# Patient Record
Sex: Female | Born: 2000 | Race: Black or African American | Hispanic: No | Marital: Single | Smoking: Never smoker
Health system: Southern US, Community
[De-identification: ages and names within clinical notes are randomized; demographics above are authoritative.]

## PROBLEM LIST (undated history)

## (undated) DIAGNOSIS — B009 Herpesviral infection, unspecified: Secondary | ICD-10-CM

## (undated) DIAGNOSIS — J45909 Unspecified asthma, uncomplicated: Secondary | ICD-10-CM

## (undated) HISTORY — DX: Herpesviral infection, unspecified: B00.9

---
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
## 2018-03-16 ENCOUNTER — Ambulatory Visit: Payer: BC Managed Care – PPO | Admitting: Nurse Practitioner

## 2018-03-16 ENCOUNTER — Encounter: Payer: Self-pay | Admitting: Nurse Practitioner

## 2018-03-16 VITALS — BP 100/64 | HR 87 | Temp 98.4°F | Ht 61.2 in | Wt 101.6 lb

## 2018-03-16 DIAGNOSIS — N946 Dysmenorrhea, unspecified: Secondary | ICD-10-CM

## 2018-03-16 DIAGNOSIS — Z0000 Encounter for general adult medical examination without abnormal findings: Secondary | ICD-10-CM | POA: Diagnosis not present

## 2018-03-16 DIAGNOSIS — R05 Cough: Secondary | ICD-10-CM

## 2018-03-16 DIAGNOSIS — N922 Excessive menstruation at puberty: Secondary | ICD-10-CM

## 2018-03-16 DIAGNOSIS — Z30011 Encounter for initial prescription of contraceptive pills: Secondary | ICD-10-CM | POA: Diagnosis not present

## 2018-03-16 DIAGNOSIS — N920 Excessive and frequent menstruation with regular cycle: Secondary | ICD-10-CM | POA: Diagnosis not present

## 2018-03-16 DIAGNOSIS — Z8709 Personal history of other diseases of the respiratory system: Secondary | ICD-10-CM

## 2018-03-16 MED ORDER — NORETHINDRONE ACET-ETHINYL EST 1-20 MG-MCG PO TABS: 1.0000 | ORAL_TABLET | Freq: Every day | ORAL | 3 refills | Status: DC

## 2018-03-16 MED ORDER — NORETHINDRONE ACET-ETHINYL EST 1-20 MG-MCG PO TABS: 1.0000 | ORAL_TABLET | Freq: Every day | ORAL | 11 refills | Status: DC

## 2018-03-16 NOTE — Progress Notes (Addendum)
Subjective:     Patient ID: Selena Wallace , female    DOB: Jul 24, 2000 , 18 y.o.   MRN: [id number]   Chief Complaint  Patient presents with  . Establish Care  . Asthma    patient needs a refill  . Contraception    HPI  Here to establish care - she had been going to [facility].  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  Her mother referred her here she works at [employer].  She is working at [employer] at [employer]. 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 She usually takes the flu shot.    PMH - asthmatic tendencies (1 asthma attack as a child).  - uses albuterol inhaler as needed.    [facility] - mother - prediabetic, father - HTN.  3 brothers and 2 sisters - all healthy.              Grandparents - diabetes.  Maternal grandfather - heart disease.  Great aunt maternal - breast cancer.              Paternal grandmother possible Alzheimer's   Non- smoker, no alcohol.    Not sexually active. LMP - 03/12/2018 - regular.  dysmenorrhgia and menorrhagia - heavy bleeding during her cycle.  She has to change her pads at least every 2 hours has intermittent dizziness.  Takes 4 ibuprofen.  Menstrual cycle last for 7 days.    Asthma  She complains of cough (dry). There is no shortness of breath or wheezing. This is a chronic problem. The current episode started more than 1 year ago. The problem occurs rarely. Associated symptoms include postnasal drip and a sore throat. Pertinent negatives include no appetite change, fever or headaches. Her symptoms are aggravated by nothing. Her symptoms are alleviated by nothing. Her past medical history is significant for asthma.  URI   This is a new problem. The current episode started in the past 7 days. There has been no fever. Associated symptoms include congestion, coughing (dry) and a sore throat. Pertinent negatives include no headaches, sinus pain or wheezing. She has tried nothing for the symptoms.     No past medical history on file.   No family history on  file.   Current Outpatient Medications:  .  Albuterol Sulfate 108 (90 Base) MCG/ACT AEPB, Inhale 2 puffs into the lungs as needed., Disp: , Rfl:    Allergies no known allergies   Review of Systems  Constitutional: Negative for appetite change and fever.  HENT: Positive for congestion, postnasal drip and sore throat. Negative for sinus pain.   Respiratory: Positive for cough (dry). Negative for shortness of breath and wheezing.   Cardiovascular: Negative.   Endocrine: Negative for polydipsia, polyphagia and polyuria.  Genitourinary: Positive for vaginal bleeding (currently on menstrual cycle). Negative for vaginal discharge.  Musculoskeletal: Negative.   Skin: Negative.   Neurological: Negative.  Negative for headaches.  Hematological: Negative.   Psychiatric/Behavioral: Negative.      Today's Vitals   03/16/18 1433  BP: 100/64  Pulse: 87  Temp: 98.4 F (36.9 C)  TempSrc: Oral  SpO2: 98%  Weight: 101 lb 9.6 oz (46.1 kg)  Height: 5' 1.2" (1.554 m)  PainSc: 0-No pain   Body mass index is 19.07 kg/m.   Objective:  Physical Exam Constitutional:      General: She is not in acute distress.    Appearance: Normal appearance. She is well-developed.  HENT:     Head: Normocephalic  and atraumatic.     Right Ear: Hearing normal.     Left Ear: Hearing normal.     Mouth/Throat:     Mouth: Mucous membranes are moist.  Eyes:     General: Lids are normal.     Conjunctiva/sclera: Conjunctivae normal.     Pupils: Pupils are equal, round, and reactive to light.     Funduscopic exam:    Right eye: No papilledema.        Left eye: No papilledema.  Neck:     Musculoskeletal: Full passive range of motion without pain, normal range of motion and neck supple.     Thyroid: No thyroid mass.     Vascular: No carotid bruit.  Cardiovascular:     Rate and Rhythm: Normal rate and regular rhythm.     Pulses: Normal pulses.     Heart sounds: Normal heart sounds. No murmur.  Pulmonary:      Effort: Pulmonary effort is normal.     Breath sounds: Normal breath sounds.  Abdominal:     General: Abdomen is flat. Bowel sounds are normal.     Palpations: Abdomen is soft.  Musculoskeletal: Normal range of motion.        General: No swelling.     Right lower leg: No edema.     Left lower leg: No edema.  Skin:    General: Skin is warm and dry.     Capillary Refill: Capillary refill takes less than 2 seconds.  Neurological:     General: No focal deficit present.     Mental Status: She is alert and oriented to person, place, and time.     Cranial Nerves: No cranial nerve deficit.     Sensory: No sensory deficit.  Psychiatric:        Mood and Affect: Mood normal.        Behavior: Behavior normal.        Thought Content: Thought content normal.        Judgment: Judgment normal.       Assessment And Plan:     1. Dysmenorrhea  Encouraged to take ibuprofen/aleve 3 days prior to start of her menstrual cycle.  2. Excessive menstruation at puberty  She will change her pad at least every 2 hours and last for 7 days  Interested in birth control pills to regulate - CBC with Diff - BMP8+eGFR  3. Oral contraception initial prescription  Denies history of headaches/migraines  Discussed Red Flag - ACHES - POCT Urine Pregnancy  4. History of asthma  Chronic, controlled  Provided her with albuterol inhaler as needed  If have to use more than 3 times in one week she is to call to office  5. Health maintenance examination . Behavior modifications discussed and diet history reviewed.   . Pt will continue to exercise regularly and modify diet with low GI, plant based foods and decrease intake of processed foods.  . Recommend intake of daily multivitamin, Vitamin D . Recommend immunizations that include influenza, TDAP (she is to get a copy of her immunization records)     Minette Brine, FNP

## 2018-03-16 NOTE — Patient Instructions (Addendum)
Dysmenorrhea Dysmenorrhea means painful cramps during your period (menstrual period). You will have pain in your lower belly (abdomen). The pain is caused by the tightening (contracting) of the muscles of the womb (uterus). The pain may be mild or very bad. With this condition, you may:  Have a headache.  Feel sick to your stomach (nauseous).  Throw up (vomit).  Have lower back pain. Follow these instructions at home: Helping pain and cramping   Put heat on your lower back or belly when you have pain or cramps. Use the heat source that your doctor tells you to use. ? Place a towel between your skin and the heat. ? Leave the heat on for 20-30 minutes. ? Remove the heat if your skin turns bright red. This is especially important if you cannot feel pain, heat, or cold. ? Do not have a heating pad on during sleep.  Do aerobic exercises. These include walking, swimming, or biking. These may help with cramps.  Massage your lower back or belly. This may help lessen pain. General instructions  Take over-the-counter and prescription medicines only as told by your doctor.  Do not drive or use heavy machinery while taking prescription pain medicine.  Avoid alcohol and caffeine during and right before your period. These can make cramps worse.  Do not use any products that have nicotine or tobacco. These include cigarettes and e-cigarettes. If you need help quitting, ask your doctor.  Keep all follow-up visits as told by your doctor. This is important. Contact a doctor if:  You have pain that gets worse.  You have pain that does not get better with medicine.  You have pain during sex.  You feel sick to your stomach or you throw up during your period, and medicine does not help. Get help right away if:  You pass out (faint). Summary  Dysmenorrhea means painful cramps during your period (menstrual period).  Put heat on your lower back or belly when you have pain or cramps.  Do  exercises like walking, swimming, or biking to help with cramps.  Contact a doctor if you have pain during sex. This information is not intended to replace advice given to you by your health care provider. Make sure you discuss any questions you have with your health care provider. Document Released: 05/22/2008 Document Revised: 03/12/2016 Document Reviewed: 03/12/2016 Elsevier Interactive Patient Education  2019 [employer] Inc[employer]   Oral Contraception Use Oral contraceptive pills (OCPs) are medicines that you take to prevent pregnancy. OCPs work by:  Preventing the ovaries from releasing eggs.  Thickening mucus in the lower part of the uterus (cervix), which prevents sperm from entering the uterus.  Thinning the lining of the uterus (endometrium), which prevents a fertilized egg from attaching to the endometrium. OCPs are highly effective when taken exactly as prescribed. However, OCPs do not prevent sexually transmitted infections (STIs). Safe sex practices, such as using condoms while on an OCP, can help prevent STIs. Before taking OCPs, you may have a physical exam, blood test, and Pap test. A Pap test involves taking a sample of cells from your cervix to check for cancer. Discuss with your health care provider the possible side effects of the OCP you may be prescribed. When you start an OCP, be aware that it can take 2-3 months for your body to adjust to changes in hormone levels. How to take oral contraceptive pills Follow instructions from your health care provider about how to start taking your first cycle of  OCPs. Your health care provider may recommend that you:  Start the pill on day 1 of your menstrual period. If you start at this time, you will not need any backup form of birth control (contraception), such as condoms.  Start the pill on the first Sunday after your menstrual period or on the day you get your prescription. In these cases, you will need to use backup contraception for  the first week.  Start the pill at any time of your cycle. ? If you take the pill within 5 days of the start of your period, you will not need a backup form of contraception. ? If you start at any other time of your menstrual cycle, you will need to use another form of contraception for 7 days. If your OCP is the type called a minipill, it will protect you from pregnancy after taking it for 2 days (48 hours), and you can stop using backup contraception after that time. After you have started taking OCPs:  If you forget to take 1 pill, take it as soon as you remember. Take the next pill at the regular time.  If you miss 2 or more pills, call your health care provider. Different pills have different instructions for missed doses. Use backup birth control until your next menstrual period starts.  If you use a 28-day pack that contains inactive pills and you miss 1 of the last 7 pills (pills with no hormones), throw away the rest of the non-hormone pills and start a new pill pack. No matter which day you start the OCP, you will always start a new pack on that same day of the week. Have an extra pack of OCPs and a backup contraceptive method available in case you miss some pills or lose your OCP pack. Follow these instructions at home:  Do not use any products that contain nicotine or tobacco, such as cigarettes and e-cigarettes. If you need help quitting, ask your health care provider.  Always use a condom to protect against STIs. OCPs do not protect against STIs.  Use a calendar to mark the days of your menstrual period.  Read the information and directions that came with your OCP. Talk to your health care provider if you have questions. Contact a health care provider if:  You develop nausea and vomiting.  You have abnormal vaginal discharge or bleeding.  You develop a rash.  You miss your menstrual period. Depending on the type of OCP you are taking, this may be a sign of pregnancy. Ask  your health care provider for more information.  You are losing your hair.  You need treatment for mood swings or depression.  You get dizzy when taking the OCP.  You develop acne after taking the OCP.  You become pregnant or think you may be pregnant.  You have diarrhea, constipation, and abdominal pain or cramps.  You miss 2 or more pills. Get help right away if:  You develop chest pain.  You develop shortness of breath.  You have an uncontrolled or severe headache.  You develop numbness or slurred speech.  You develop visual or speech problems.  You develop pain, redness, and swelling in your legs.  You develop weakness or numbness in your arms or legs. Summary  Oral contraceptive pills (OCPs) are medicines that you take to prevent pregnancy.  OCPs do not prevent sexually transmitted infections (STIs). Always use a condom to protect against STIs.  When you start an OCP, be aware  that it can take 2-3 months for your body to adjust to changes in hormone levels.  Read all the information and directions that come with your OCP. This information is not intended to replace advice given to you by your health care provider. Make sure you discuss any questions you have with your health care provider. Document Released: 02/12/2011 Document Revised: 04/06/2016 Document Reviewed: 04/06/2016 Elsevier Interactive Patient Education  2019 [employer].

## 2018-03-17 LAB — BMP8+EGFR
BUN / CREAT RATIO: 21 (ref 9–23)
BUN: 16 mg/dL (ref 6–20)
CHLORIDE: 103 mmol/L (ref 96–106)
CO2: 22 mmol/L (ref 20–29)
Calcium: 9.5 mg/dL (ref 8.7–10.2)
Creatinine, Ser: 0.75 mg/dL (ref 0.57–1.00)
GFR calc Af Amer: 134 mL/min/{1.73_m2} (ref >59)
GFR calc non Af Amer: 116 mL/min/{1.73_m2} (ref >59)
GLUCOSE: 70 mg/dL (ref 65–99)
Potassium: 4.4 mmol/L (ref 3.5–5.2)
Sodium: 140 mmol/L (ref 134–144)

## 2018-03-17 LAB — CBC WITH DIFFERENTIAL/PLATELET
BASOS ABS: 0 10*3/uL (ref 0.0–0.2)
Basos: 1 %
EOS (ABSOLUTE): 0.1 10*3/uL (ref 0.0–0.4)
Eos: 2 %
Hematocrit: 40.6 % (ref 34.0–46.6)
Hemoglobin: 13.3 g/dL (ref 11.1–15.9)
IMMATURE GRANULOCYTES: 0 %
Immature Grans (Abs): 0 10*3/uL (ref 0.0–0.1)
LYMPHS: 23 %
Lymphocytes Absolute: 1.7 10*3/uL (ref 0.7–3.1)
MCH: 27.5 pg (ref 26.6–33.0)
MCHC: 32.8 g/dL (ref 31.5–35.7)
MCV: 84 fL (ref 79–97)
Monocytes Absolute: 0.4 10*3/uL (ref 0.1–0.9)
Monocytes: 5 %
NEUTROS PCT: 69 %
Neutrophils Absolute: 5 10*3/uL (ref 1.4–7.0)
PLATELETS: 316 10*3/uL (ref 150–450)
RBC: 4.83 x10E6/uL (ref 3.77–5.28)
RDW: 12.6 % (ref 11.7–15.4)
WBC: 7.3 10*3/uL (ref 3.4–10.8)

## 2018-04-03 ENCOUNTER — Encounter: Payer: Self-pay | Admitting: Nurse Practitioner

## 2018-04-03 LAB — POCT URINE PREGNANCY: PREG TEST UR: NEGATIVE

## 2018-05-19 ENCOUNTER — Ambulatory Visit: Payer: BC Managed Care – PPO | Admitting: Nurse Practitioner

## 2018-05-19 ENCOUNTER — Other Ambulatory Visit: Payer: Self-pay

## 2018-05-19 ENCOUNTER — Encounter: Payer: Self-pay | Admitting: Nurse Practitioner

## 2018-05-19 VITALS — BP 100/60 | HR 84 | Temp 98.1°F | Wt 103.2 lb

## 2018-05-19 DIAGNOSIS — J029 Acute pharyngitis, unspecified: Secondary | ICD-10-CM | POA: Diagnosis not present

## 2018-05-19 DIAGNOSIS — Z30011 Encounter for initial prescription of contraceptive pills: Secondary | ICD-10-CM | POA: Diagnosis not present

## 2018-05-19 DIAGNOSIS — J309 Allergic rhinitis, unspecified: Secondary | ICD-10-CM

## 2018-05-19 LAB — POCT RAPID STREP A (OFFICE): RAPID STREP A SCREEN: NEGATIVE

## 2018-05-19 MED ORDER — NORETHINDRONE ACET-ETHINYL EST 1-20 MG-MCG PO TABS: 1.0000 | ORAL_TABLET | Freq: Every day | ORAL | 2 refills | Status: DC

## 2018-05-19 MED ORDER — CARBINOXAMINE MALEATE 6 MG PO TABS: 1.0000 | ORAL_TABLET | Freq: Two times a day (BID) | ORAL | 1 refills | Status: DC

## 2018-05-19 NOTE — Progress Notes (Signed)
  Subjective:     Patient ID: Selena Wallace , female    DOB: May 07, 2000 , 18 y.o.   MRN: [id number]   Chief Complaint  Patient presents with  . left ear  feels full     sinus drain in throat,  left ear feels full, no fever , had some runny nose     HPI  Allergy symptoms - started on Monday with post nasal drainage.  She continues to have left ear discomfort at night. No fever.  No recent travel out of the country.      No past medical history on file.   No family history on file.   Current Outpatient Medications:  .  Albuterol Sulfate 108 (90 Base) MCG/ACT AEPB, Inhale 2 puffs into the lungs as needed., Disp: , Rfl:  .  norethindrone-ethinyl estradiol (LOESTRIN 1/20, 21,) 1-20 MG-MCG tablet, Take 1 tablet by mouth daily., Disp: 3 Package, Rfl: 3   No Known Allergies   Review of Systems  Constitutional: Negative for fatigue.  HENT: Negative for congestion, sinus pressure, sinus pain and sore throat.   Respiratory: Negative for cough.   Cardiovascular: Negative.  Negative for chest pain, palpitations and leg swelling.  Endocrine: Negative for polydipsia, polyphagia and polyuria.     Today's Vitals   05/19/18 1444  BP: 100/60  Pulse: 84  Temp: 98.1 F (36.7 C)  SpO2: 97%  Weight: 103 lb 3.2 oz (46.8 kg)   Body mass index is 19.37 kg/m.   Objective:  Physical Exam Vitals signs reviewed.  Constitutional:      Appearance: Normal appearance.  HENT:     Right Ear: Tympanic membrane, ear canal and external ear normal. There is no impacted cerumen.     Left Ear: Ear canal and external ear normal. There is no impacted cerumen.     Nose: Nose normal. No congestion.     Mouth/Throat:     Mouth: Mucous membranes are moist.     Pharynx: Oropharyngeal exudate (left tonsil) present.  Cardiovascular:     Rate and Rhythm: Normal rate and regular rhythm.     Pulses: Normal pulses.     Heart sounds: Normal heart sounds. No murmur.  Neurological:     Mental Status: She is  alert.         Assessment And Plan:   1. Oral contraception initial prescription  Discussed if have headaches, shortness of breath or sudden onset leg pain/swelling to contact office - norethindrone-ethinyl estradiol (MICROGESTIN,JUNEL,LOESTRIN) 1-20 MG-MCG tablet; Take 1 tablet by mouth daily.  Dispense: 3 Package; Refill: 2  2. Allergic rhinitis, unspecified seasonality, unspecified trigger  Slightly erythematous oropharynx  Will try ryvent - Carbinoxamine Maleate (RYVENT) 6 MG TABS; Take 1 each by mouth 2 (two) times daily.  Dispense: 60 tablet; Refill: 1  3. Sore throat  Negative rapid strep - POCT rapid strep A       Selena Felts, FNP

## 2018-06-06 ENCOUNTER — Encounter: Payer: Self-pay | Admitting: Nurse Practitioner

## 2018-06-06 DIAGNOSIS — J309 Allergic rhinitis, unspecified: Secondary | ICD-10-CM | POA: Insufficient documentation

## 2018-06-06 DIAGNOSIS — J029 Acute pharyngitis, unspecified: Secondary | ICD-10-CM | POA: Insufficient documentation

## 2018-09-01 ENCOUNTER — Other Ambulatory Visit: Payer: Self-pay

## 2018-09-01 ENCOUNTER — Encounter: Payer: Self-pay | Admitting: Nurse Practitioner

## 2018-09-01 ENCOUNTER — Ambulatory Visit: Payer: BC Managed Care – PPO | Admitting: Nurse Practitioner

## 2018-09-01 VITALS — BP 102/62 | HR 65 | Temp 98.3°F | Ht 61.6 in | Wt 106.0 lb

## 2018-09-01 DIAGNOSIS — R350 Frequency of micturition: Secondary | ICD-10-CM | POA: Diagnosis not present

## 2018-09-01 DIAGNOSIS — R1013 Epigastric pain: Secondary | ICD-10-CM | POA: Diagnosis not present

## 2018-09-01 DIAGNOSIS — K5900 Constipation, unspecified: Secondary | ICD-10-CM | POA: Diagnosis not present

## 2018-09-01 LAB — POCT URINALYSIS DIPSTICK
Bilirubin, UA: NEGATIVE
Glucose, UA: NEGATIVE
Leukocytes, UA: NEGATIVE
Nitrite, UA: NEGATIVE
Protein, UA: POSITIVE — AB
Spec Grav, UA: 1.025 (ref 1.010–1.025)
Urobilinogen, UA: 1 E.U./dL
pH, UA: 6.5 (ref 5.0–8.0)

## 2018-09-01 NOTE — Patient Instructions (Signed)
Take over the counter 1 tsp benefiber daily  Increase your intake fiber and water at least 4 bottles a day  Eat a healthy diet rich in vegetables.    Healthy Eating Following a healthy eating pattern may help you to achieve and maintain a healthy body weight, reduce the risk of chronic disease, and live a long and productive life. It is important to follow a healthy eating pattern at an appropriate calorie level for your body. Your nutritional needs should be met primarily through food by choosing a variety of nutrient-rich foods. What are tips for following this plan? Reading food labels  Read labels and choose the following: ? Reduced or low sodium. ? Juices with 100% fruit juice. ? Foods with low saturated fats and high polyunsaturated and monounsaturated fats. ? Foods with whole grains, such as whole wheat, cracked wheat, brown rice, and wild rice. ? Whole grains that are fortified with folic acid. This is recommended for women who are pregnant or who want to become pregnant.  Read labels and avoid the following: ? Foods with a lot of added sugars. These include foods that contain brown sugar, corn sweetener, corn syrup, dextrose, fructose, glucose, high-fructose corn syrup, honey, invert sugar, lactose, malt syrup, maltose, molasses, raw sugar, sucrose, trehalose, or turbinado sugar.  Do not eat more than the following amounts of added sugar per day:  6 teaspoons (25 g) for women.  9 teaspoons (38 g) for men. ? Foods that contain processed or refined starches and grains. ? Refined grain products, such as white flour, degermed cornmeal, white bread, and white rice. Shopping  Choose nutrient-rich snacks, such as vegetables, whole fruits, and nuts. Avoid high-calorie and high-sugar snacks, such as potato chips, fruit snacks, and candy.  Use oil-based dressings and spreads on foods instead of solid fats such as butter, stick margarine, or cream cheese.  Limit pre-made sauces,  mixes, and "instant" products such as flavored rice, instant noodles, and ready-made pasta.  Try more plant-protein sources, such as tofu, tempeh, black beans, edamame, lentils, nuts, and seeds.  Explore eating plans such as the Mediterranean diet or vegetarian diet. Cooking  Use oil to saut or stir-fry foods instead of solid fats such as butter, stick margarine, or lard.  Try baking, boiling, grilling, or broiling instead of frying.  Remove the fatty part of meats before cooking.  Steam vegetables in water or broth. Meal planning   At meals, imagine dividing your plate into fourths: ? One-half of your plate is fruits and vegetables. ? One-fourth of your plate is whole grains. ? One-fourth of your plate is protein, especially lean meats, poultry, eggs, tofu, beans, or nuts.  Include low-fat dairy as part of your daily diet. Lifestyle  Choose healthy options in all settings, including home, work, school, restaurants, or stores.  Prepare your food safely: ? Wash your hands after handling raw meats. ? Keep food preparation surfaces clean by regularly washing with hot, soapy water. ? Keep raw meats separate from ready-to-eat foods, such as fruits and vegetables. ? Cook seafood, meat, poultry, and eggs to the recommended internal temperature. ? Store foods at safe temperatures. In general:  Keep cold foods at 47F (4.4C) or below.  Keep hot foods at 147F (60C) or above.  Keep your freezer at [facility] (-17.8C) or below.  Foods are no longer safe to eat when they have been between the temperatures of 40-147F (4.4-60C) for more than 2 hours. What foods should I eat? Fruits Aim to  eat 2 cup-equivalents of fresh, canned (in natural juice), or frozen fruits each day. Examples of 1 cup-equivalent of fruit include 1 small apple, 8 large strawberries, 1 cup canned fruit,  cup dried fruit, or 1 cup 100% juice. Vegetables Aim to eat 2-3 cup-equivalents of fresh and frozen vegetables  each day, including different varieties and colors. Examples of 1 cup-equivalent of vegetables include 2 medium carrots, 2 cups raw, leafy greens, 1 cup chopped vegetable (raw or cooked), or 1 medium baked potato. Grains Aim to eat 6 ounce-equivalents of whole grains each day. Examples of 1 ounce-equivalent of grains include 1 slice of bread, 1 cup ready-to-eat cereal, 3 cups popcorn, or  cup cooked rice, pasta, or cereal. Meats and other proteins Aim to eat 5-6 ounce-equivalents of protein each day. Examples of 1 ounce-equivalent of protein include 1 egg, 1/2 cup nuts or seeds, or 1 tablespoon (16 g) peanut butter. A cut of meat or fish that is the size of a deck of cards is about 3-4 ounce-equivalents.  Of the protein you eat each week, try to have at least 8 ounces come from seafood. This includes salmon, trout, herring, and anchovies. Dairy Aim to eat 3 cup-equivalents of fat-free or low-fat dairy each day. Examples of 1 cup-equivalent of dairy include 1 cup (240 mL) milk, 8 ounces (250 g) yogurt, 1 ounces (44 g) natural cheese, or 1 cup (240 mL) fortified soy milk. Fats and oils  Aim for about 5 teaspoons (21 g) per day. Choose monounsaturated fats, such as canola and olive oils, avocados, peanut butter, and most nuts, or polyunsaturated fats, such as sunflower, corn, and soybean oils, walnuts, pine nuts, sesame seeds, sunflower seeds, and flaxseed. Beverages  Aim for six 8-oz glasses of water per day. Limit coffee to three to five 8-oz cups per day.  Limit caffeinated beverages that have added calories, such as soda and energy drinks.  Limit alcohol intake to no more than 1 drink a day for nonpregnant women and 2 drinks a day for men. One drink equals 12 oz of beer (355 mL), 5 oz of wine (148 mL), or 1 oz of hard liquor (44 mL). Seasoning and other foods  Avoid adding excess amounts of salt to your foods. Try flavoring foods with herbs and spices instead of salt.  Avoid adding sugar  to foods.  Try using oil-based dressings, sauces, and spreads instead of solid fats. This information is based on general [place] nutrition guidelines. For more information, visit [employer]. Exact amounts may vary based on your nutrition needs. Summary  A healthy eating plan may help you to maintain a healthy weight, reduce the risk of chronic diseases, and stay active throughout your life.  Plan your meals. Make sure you eat the right portions of a variety of nutrient-rich foods.  Try baking, boiling, grilling, or broiling instead of frying.  Choose healthy options in all settings, including home, work, school, restaurants, or stores. This information is not intended to replace advice given to you by your health care provider. Make sure you discuss any questions you have with your health care provider. Document Released: 06/07/2017 Document Revised: 06/07/2017 Document Reviewed: 06/07/2017 Elsevier Interactive Patient Education  2019 [employer].   Constipation, Adult Constipation is when a person:  Poops (has a bowel movement) fewer times in a week than normal.  Has a hard time pooping.  Has poop that is dry, hard, or bigger than normal. Follow these instructions at home: Eating and drinking  Eat foods that have a lot of fiber, such as: ? Fresh fruits and vegetables. ? Whole grains. ? Beans.  Eat less of foods that are high in fat, low in fiber, or overly processed, such as: ? [place] fries. ? Hamburgers. ? Cookies. ? Candy. ? Soda.  Drink enough fluid to keep your pee (urine) clear or pale yellow. General instructions  Exercise regularly or as told by your doctor.  Go to the restroom when you feel like you need to poop. Do not hold it in.  Take over-the-counter and prescription medicines only as told by your doctor. These include any fiber supplements.  Do pelvic floor retraining exercises, such as: ? Doing deep breathing while relaxing your lower belly  (abdomen). ? Relaxing your pelvic floor while pooping.  Watch your condition for any changes.  Keep all follow-up visits as told by your doctor. This is important. Contact a doctor if:  You have pain that gets worse.  You have a fever.  You have not pooped for 4 days.  You throw up (vomit).  You are not hungry.  You lose weight.  You are bleeding from the anus.  You have thin, pencil-like poop (stool). Get help right away if:  You have a fever, and your symptoms suddenly get worse.  You leak poop or have blood in your poop.  Your belly feels hard or bigger than normal (is bloated).  You have very bad belly pain.  You feel dizzy or you faint. This information is not intended to replace advice given to you by your health care provider. Make sure you discuss any questions you have with your health care provider. Document Released: 08/12/2007 Document Revised: 09/13/2015 Document Reviewed: 08/14/2015 Elsevier Interactive Patient Education  2019 [employer].

## 2018-09-01 NOTE — Progress Notes (Signed)
Subjective:     Patient ID: Selena Wallace , female    DOB: Sep 15, 2000 , 18 y.o.   MRN: [id number]   Chief Complaint  Patient presents with  . Abdominal Pain    patient states after she eats her stomach still hurts like she is still hungry pt states if she doesnt eat she feels dizzy  . Dysuria    patient states she is urinating more she denies any burning or discharge.    HPI  She feels like she is having "hunger" pains for the last week.  She feels like she always hungry.    Breakfast with oatmeal and water or cereal (frosted flakes) Lunch - burger with french fries - water Dinner - chicken wings or subway or spaghetti.    No vegetables in her diet  Exercising about 2 weeks ago where she is working out every day.  Running alternating with strength training.  Drinks about 2 16 oz bottles of water a day  Mild nausea.    She has had 1 bowel movement in the last 2 days.    Wt Readings from Last 3 Encounters: 09/01/18 : 106 lb (48.1 kg) (10 %, Z= -1.31)* 05/19/18 : 103 lb 3.2 oz (46.8 kg) (7 %, Z= -1.51)* 03/16/18 : 101 lb 9.6 oz (46.1 kg) (5 %, Z= -1.63)*  * Growth percentiles are based on CDC (Girls, 2-20 Years) data.   Abdominal Pain This is a new problem. The current episode started in the past 7 days. The onset quality is sudden. Pain location: mid epigastric region. Quality: cramping. Associated symptoms include dysuria. Pertinent negatives include no headaches. She has tried antacids for the symptoms.  Dysuria      No past medical history on file.   No family history on file.   Current Outpatient Medications:  .  Albuterol Sulfate 108 (90 Base) MCG/ACT AEPB, Inhale 2 puffs into the lungs as needed., Disp: , Rfl:  .  norethindrone-ethinyl estradiol (MICROGESTIN,JUNEL,LOESTRIN) 1-20 MG-MCG tablet, Take 1 tablet by mouth daily., Disp: 3 Package, Rfl: 2   No Known Allergies   Review of Systems  Constitutional: Negative.   Respiratory: Negative for cough.    Cardiovascular: Negative.   Gastrointestinal: Positive for abdominal pain.  Endocrine: Negative for polydipsia, polyphagia and polyuria.  Genitourinary: Positive for dysuria.  Neurological: Positive for dizziness. Negative for headaches.     Today's Vitals   09/01/18 0847  BP: 102/62  Pulse: 65  Temp: 98.3 F (36.8 C)  TempSrc: Oral  Weight: 106 lb (48.1 kg)  Height: 5' 1.6" (1.565 m)  PainSc: 0-No pain   Body mass index is 19.64 kg/m.   Objective:  Physical Exam Vitals signs reviewed.  Constitutional:      Appearance: She is well-developed.  Abdominal:     General: Bowel sounds are normal.     Palpations: Abdomen is soft.     Tenderness: There is no abdominal tenderness.  Skin:    General: Skin is warm and dry.     Capillary Refill: Capillary refill takes less than 2 seconds.  Neurological:     General: No focal deficit present.     Mental Status: She is alert and oriented to person, place, and time.  Psychiatric:        Mood and Affect: Mood normal.        Behavior: Behavior normal.         Assessment And Plan:     1. Epigastric pain  She does  not have a great diet eats fast food often  Advised to avoid greasy foods and to eat 5-6 small meals daily - CMP14 + Anion Gap - T3 - T4, Free  2. Urinary frequency  Urinalysis is normal  Will check metabolic labs since she reports she never feels full - CMP14 + Anion Gap - T3 - T4, Free - Hemoglobin A1c - TSH - POCT Urinalysis Dipstick (81002)  3. Constipation, unspecified constipation type  She is to take benefiber daily to see if this helps her constipation  Also may be brought on due to poor diet  Good bowel sounds   Minette Brine, FNP    THE PATIENT IS ENCOURAGED TO PRACTICE SOCIAL DISTANCING DUE TO THE COVID-19 PANDEMIC.

## 2018-09-02 LAB — CMP14 + ANION GAP
ALT: 14 IU/L (ref 0–32)
AST: 17 IU/L (ref 0–40)
Albumin/Globulin Ratio: 1.8 (ref 1.2–2.2)
Albumin: 4.6 g/dL (ref 3.9–5.0)
Alkaline Phosphatase: 47 IU/L (ref 39–117)
Anion Gap: 16 mmol/L (ref 10.0–18.0)
BUN/Creatinine Ratio: 18 (ref 9–23)
BUN: 17 mg/dL (ref 6–20)
Bilirubin Total: 0.7 mg/dL (ref 0.0–1.2)
CO2: 23 mmol/L (ref 20–29)
Calcium: 9.6 mg/dL (ref 8.7–10.2)
Chloride: 102 mmol/L (ref 96–106)
Creatinine, Ser: 0.94 mg/dL (ref 0.57–1.00)
GFR calc Af Amer: 102 mL/min/{1.73_m2} (ref >59)
GFR calc non Af Amer: 88 mL/min/{1.73_m2} (ref >59)
Globulin, Total: 2.5 g/dL (ref 1.5–4.5)
Glucose: 84 mg/dL (ref 65–99)
Potassium: 4.3 mmol/L (ref 3.5–5.2)
Sodium: 141 mmol/L (ref 134–144)
Total Protein: 7.1 g/dL (ref 6.0–8.5)

## 2018-09-02 LAB — T4, FREE: Free T4: 1.36 ng/dL (ref 0.93–1.60)

## 2018-09-02 LAB — TSH: TSH: 2.12 u[IU]/mL (ref 0.450–4.500)

## 2018-09-02 LAB — HEMOGLOBIN A1C
Est. average glucose Bld gHb Est-mCnc: 117 mg/dL
Hgb A1c MFr Bld: 5.7 % — ABNORMAL HIGH (ref 4.8–5.6)

## 2018-09-02 LAB — T3: T3, Total: 142 ng/dL (ref 71–180)

## 2018-12-08 ENCOUNTER — Ambulatory Visit (HOSPITAL_COMMUNITY)
Admission: EM | Admit: 2018-12-08 | Discharge: 2018-12-08 | Disposition: A | Discharge status: Home / Self Care | Payer: BC Managed Care – PPO | Attending: Urgent Care | Admitting: Urgent Care

## 2018-12-08 ENCOUNTER — Other Ambulatory Visit: Payer: Self-pay

## 2018-12-08 ENCOUNTER — Encounter (HOSPITAL_COMMUNITY): Payer: Self-pay | Admitting: Urgent Care

## 2018-12-08 DIAGNOSIS — J029 Acute pharyngitis, unspecified: Secondary | ICD-10-CM | POA: Diagnosis not present

## 2018-12-08 DIAGNOSIS — Z8709 Personal history of other diseases of the respiratory system: Secondary | ICD-10-CM | POA: Diagnosis present

## 2018-12-08 DIAGNOSIS — R0789 Other chest pain: Secondary | ICD-10-CM

## 2018-12-08 DIAGNOSIS — J3089 Other allergic rhinitis: Secondary | ICD-10-CM | POA: Insufficient documentation

## 2018-12-08 DIAGNOSIS — R590 Localized enlarged lymph nodes: Secondary | ICD-10-CM | POA: Insufficient documentation

## 2018-12-08 HISTORY — DX: Unspecified asthma, uncomplicated: J45.909

## 2018-12-08 LAB — POCT INFECTIOUS MONO SCREEN: Mono Screen: NEGATIVE

## 2018-12-08 LAB — POCT RAPID STREP A: Streptococcus, Group A Screen (Direct): NEGATIVE

## 2018-12-08 MED ORDER — CETIRIZINE HCL 10 MG PO TABS: 10.0000 mg | ORAL_TABLET | Freq: Every day | ORAL | 11 refills | Status: DC

## 2018-12-08 MED ORDER — NAPROXEN 500 MG PO TABS: 500.0000 mg | ORAL_TABLET | Freq: Two times a day (BID) | ORAL | 0 refills | Status: DC

## 2018-12-08 NOTE — ED Provider Notes (Signed)
MRN: [id number] DOB: November 12, 2000  Subjective:   Selena Wallace is a 18 y.o. female presenting for 3-week history of persistent throat pain, chest tightness and mild occasional shortness of breath.  Patient states that she is now having lymph node swelling of her neck.  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  All of her testing has been negative.  Patient has a history of childhood asthma, states that she does not have any problems with this in most recent years.  She also has a history of allergies but does not take any allergy medication consistently.  No current facility-administered medications for this encounter.   Current Outpatient Medications:  .  Albuterol Sulfate 108 (90 Base) MCG/ACT AEPB, Inhale 2 puffs into the lungs as needed., Disp: , Rfl:  .  norethindrone-ethinyl estradiol (MICROGESTIN,JUNEL,LOESTRIN) 1-20 MG-MCG tablet, Take 1 tablet by mouth daily., Disp: 3 Package, Rfl: 2   No Known Allergies  Past Medical History:  Diagnosis Date  . Asthma      Denies psh.    Review of Systems  Constitutional: Negative for fever and malaise/fatigue.  HENT: Positive for sore throat. Negative for congestion, ear pain and sinus pain.   Eyes: Negative for discharge and redness.  Respiratory: Positive for shortness of breath. Negative for cough, hemoptysis and wheezing.   Cardiovascular: Negative for chest pain.  Gastrointestinal: Negative for abdominal pain, diarrhea, nausea and vomiting.  Genitourinary: Negative for dysuria, flank pain and hematuria.  Musculoskeletal: Negative for myalgias.  Skin: Negative for rash.  Neurological: Negative for dizziness, weakness and headaches.  Psychiatric/Behavioral: Negative for depression and substance abuse.    Objective:   Vitals: BP 129/71 (BP Location: Left Arm)   Pulse 78   Temp 98.1 F (36.7 C) (Temporal)   Resp 16   LMP 11/27/2018   SpO2 99%   Physical Exam Constitutional:      General: She  is not in acute distress.    Appearance: Normal appearance. She is well-developed. She is not ill-appearing, toxic-appearing or diaphoretic.  HENT:     Head: Normocephalic and atraumatic.     Right Ear: Ear canal normal.     Left Ear: Ear canal normal.     Nose: Nose normal. No congestion or rhinorrhea.     Mouth/Throat:     Mouth: Mucous membranes are moist. No oral lesions.     Pharynx: No pharyngeal swelling, oropharyngeal exudate, posterior oropharyngeal erythema or uvula swelling.     Tonsils: No tonsillar exudate or tonsillar abscesses.     Comments: Significant postnasal drainage and posterior oropharynx. Eyes:     General: No scleral icterus.    Extraocular Movements: Extraocular movements intact.     Right eye: Normal extraocular motion.     Left eye: Normal extraocular motion.     Conjunctiva/sclera: Conjunctivae normal.     Pupils: Pupils are equal, round, and reactive to light.  Neck:     Musculoskeletal: Normal range of motion and neck supple.  Cardiovascular:     Rate and Rhythm: Normal rate and regular rhythm.     Pulses: Normal pulses.     Heart sounds: Normal heart sounds. No murmur. No friction rub. No gallop.   Pulmonary:     Effort: Pulmonary effort is normal. No respiratory distress.     Breath sounds: Normal breath sounds. No stridor. No wheezing, rhonchi or rales.  Lymphadenopathy:     Cervical: Cervical adenopathy present.  Skin:    General:  Skin is warm and dry.     Findings: No rash.  Neurological:     General: No focal deficit present.     Mental Status: She is alert and oriented to person, place, and time.  Psychiatric:        Mood and Affect: Mood normal.        Behavior: Behavior normal.        Thought Content: Thought content normal.     Results for orders placed or performed during the hospital encounter of 12/08/18 (from the past 24 hour(s))  Infectious mono screen, POC     Status: None   Collection Time: 12/08/18  8:12 PM  Result Value  Ref Range   Mono Screen NEGATIVE NEGATIVE  POCT rapid strep A [facility])     Status: None   Collection Time: 12/08/18  8:12 PM  Result Value Ref Range   Streptococcus, Group A Screen (Direct) NEGATIVE NEGATIVE    Assessment and Plan :   1. Sore throat   2. Chest tightness   3. History of asthma   4. Allergic rhinitis due to other allergic trigger, unspecified seasonality   5. Lymphadenopathy, anterior cervical     Counseled patient on need to address allergic rhinitis with daily antihistamine and decongestant prn. Strep culture is pending. Counseled patient on potential for adverse effects with medications prescribed/recommended today, ER and return-to-clinic precautions discussed, patient verbalized understanding.    Jaynee Eagles, PA-C 12/13/18 1023

## 2018-12-08 NOTE — ED Triage Notes (Signed)
Pt c/o sore throat x3 weeks, has been tested negative for covid several times, . Pt also c/o sore lymph nodes, chest tightness, last time she was tested was yesterday, which was negative.

## 2018-12-11 LAB — CULTURE, GROUP A STREP (THRC)

## 2019-03-07 ENCOUNTER — Encounter: Payer: Self-pay | Admitting: Nurse Practitioner

## 2019-03-09 ENCOUNTER — Other Ambulatory Visit: Payer: Self-pay

## 2019-03-09 ENCOUNTER — Telehealth (INDEPENDENT_AMBULATORY_CARE_PROVIDER_SITE_OTHER): Payer: BC Managed Care – PPO | Admitting: Nurse Practitioner

## 2019-03-09 ENCOUNTER — Encounter: Payer: Self-pay | Admitting: Nurse Practitioner

## 2019-03-09 DIAGNOSIS — U071 COVID-19: Secondary | ICD-10-CM

## 2019-03-09 DIAGNOSIS — R05 Cough: Secondary | ICD-10-CM

## 2019-03-09 DIAGNOSIS — R059 Cough, unspecified: Secondary | ICD-10-CM

## 2019-03-09 NOTE — Progress Notes (Signed)
Virtual Visit via Video   This visit type was conducted due to national recommendations for restrictions regarding the COVID-19 Pandemic (e.g. social distancing) in an effort to limit this patient's exposure and mitigate transmission in our community.  Due to her co-morbid illnesses, this patient is at least at moderate risk for complications without adequate follow up.  This format is felt to be most appropriate for this patient at this time.  All issues noted in this document were discussed and addressed.  A limited physical exam was performed with this format.    This visit type was conducted due to national recommendations for restrictions regarding the COVID-19 Pandemic (e.g. social distancing) in an effort to limit this patient's exposure and mitigate transmission in our community.  Patients identity confirmed using two different identifiers.  This format is felt to be most appropriate for this patient at this time.  All issues noted in this document were discussed and addressed.  No physical exam was performed (except for noted visual exam findings with Video Visits).    Date:  03/09/2019   ID:  [place], DOB [birth year], MRN [id number]  Patient Location:  Home - spoke with Doy Hutching  Provider location:   Office    Chief Complaint:    History of Present Illness:    Selena Wallace is a 18 y.o. female who presents via video conferencing for a telehealth visit today.    The patient does have symptoms concerning for COVID-19 infection (fever, chills, cough, or new shortness of breath).   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  Fatigued and stuffy nose.  The day she received her results she had back aches, headache and fever.  She is able to taste and smell but is dull.  Denies unusual cough, denies shortness of breath.    [facility] She took Tylenol for back pain, her menstrual cycle  is also on.      Past Medical History:  Diagnosis Date  . Asthma    No past surgical history on file.   Current Meds  Medication Sig  . Albuterol Sulfate 108 (90 Base) MCG/ACT AEPB Inhale 2 puffs into the lungs as needed.  Marland Kitchen levonorgestrel (MIRENA, 52 MG,) 20 MCG/24HR IUD 1 each by Intrauterine route once.  . naproxen (NAPROSYN) 500 MG tablet Take 1 tablet (500 mg total) by mouth 2 (two) times daily.     Allergies:   Patient has no known allergies.   Social History   Tobacco Use  . Smoking status: Never Smoker  . Smokeless tobacco: Never Used  Substance Use Topics  . Alcohol use: Never  . Drug use: Never     Family Hx: The patient's family history is not on file.  ROS:   Please see the history of present illness.    Review of Systems  Constitutional: Positive for fever and malaise/fatigue.  Respiratory: Negative.  Negative for cough and shortness of breath.   Cardiovascular: Negative.  Negative for chest pain and palpitations.  Neurological: Negative for dizziness and tingling.  Psychiatric/Behavioral: Negative.     All other systems reviewed and are negative.   Labs/Other Tests and Data Reviewed:    Recent Labs: 03/16/2018: Hemoglobin 13.3; Platelets 316 09/01/2018: ALT 14; BUN 17; Creatinine, Ser 0.94; Potassium 4.3; Sodium 141; TSH 2.120   Recent Lipid Panel No results found for: CHOL, TRIG, HDL, CHOLHDL, LDLCALC,  LDLDIRECT  Wt Readings from Last 3 Encounters:  09/01/18 106 lb (48.1 kg) (10 %, Z= -1.31)*  05/19/18 103 lb 3.2 oz (46.8 kg) (7 %, Z= -1.51)*  03/16/18 101 lb 9.6 oz (46.1 kg) (5 %, Z= -1.63)*   * Growth percentiles are based on CDC (Girls, 2-20 Years) data.     Exam:    Vital Signs:  There were no vitals taken for this visit.    Physical Exam  Constitutional: She is oriented to person, place, and time and well-developed, well-nourished, and in no distress. No distress.  Pulmonary/Chest: Effort normal. No respiratory distress.    Neurological: She is alert and oriented to person, place, and time.  Psychiatric: Mood, memory, affect and judgment normal.    ASSESSMENT & PLAN:    1. COVID-19  She was diagnosed at her job  She is doing better today  2. Cough  Intermittent cough but will take over the counter medication to help  Encouraged if has shortness of breath to go to ER for evaluation    COVID-19 Education: The signs and symptoms of COVID-19 were discussed with the patient and how to seek care for testing (follow up with PCP or arrange E-visit).  The importance of social distancing was discussed today.  Patient Risk:   After full review of this patients clinical status, I feel that they are at least moderate risk at this time.  Time:   Today, I have spent 13 minutes/ seconds with the patient with telehealth technology discussing above diagnoses.     Medication Adjustments/Labs and Tests Ordered: Current medicines are reviewed at length with the patient today.  Concerns regarding medicines are outlined above.   Tests Ordered: No orders of the defined types were placed in this encounter.   Medication Changes: No orders of the defined types were placed in this encounter.   Disposition:  Follow up prn  Signed, Minette Brine, FNP

## 2019-08-10 ENCOUNTER — Ambulatory Visit: Payer: BC Managed Care – PPO | Admitting: Nurse Practitioner

## 2019-08-10 ENCOUNTER — Encounter: Payer: Self-pay | Admitting: Nurse Practitioner

## 2019-08-10 ENCOUNTER — Other Ambulatory Visit: Payer: Self-pay

## 2019-08-10 VITALS — BP 110/76 | HR 72 | Temp 98.2°F | Ht 61.6 in | Wt 103.2 lb

## 2019-08-10 DIAGNOSIS — R05 Cough: Secondary | ICD-10-CM | POA: Diagnosis not present

## 2019-08-10 DIAGNOSIS — R059 Cough, unspecified: Secondary | ICD-10-CM

## 2019-08-10 NOTE — Progress Notes (Signed)
This visit occurred during the SARS-CoV-2 public health emergency.  Safety protocols were in place, including screening questions prior to the visit, additional usage of staff PPE, and extensive cleaning of exam room while observing appropriate contact time as indicated for disinfecting solutions.  Subjective:     Patient ID: Selena Wallace , female    DOB: [birth year] , 18 y.o.   MRN: [id number]   Chief Complaint  Patient presents with   Cough    patient stated she feels like something is in her chest and she is constantly  clearing her throat     HPI  She always feels like she has something stuck in her throat. She felt sick the week before the 29th of May, was tested for covid was negative.  Denies history of allergies.  She had been taking cold and cough but not taking in the past 2 days. She does feel some burping occasionally.  Feels like the congestion is coming up from her chest. Eats at the latest 10 pm and go to bed at 1 am.      Past Medical History:  Diagnosis Date   Asthma      No family history on file.   Current Outpatient Medications:    levonorgestrel (MIRENA, 52 MG,) 20 MCG/24HR IUD, 1 each by Intrauterine route once., Disp: , Rfl:    naproxen (NAPROSYN) 500 MG tablet, Take 1 tablet (500 mg total) by mouth 2 (two) times daily., Disp: 30 tablet, Rfl: 0   cetirizine (ZYRTEC ALLERGY) 10 MG tablet, Take 1 tablet (10 mg total) by mouth daily. (Patient not taking: Reported on 03/09/2019), Disp: 30 tablet, Rfl: 11   No Known Allergies   Review of Systems  Constitutional: Negative.   HENT: Negative for congestion, postnasal drip and sore throat.   Respiratory: Positive for cough.   Cardiovascular: Negative.  Negative for chest pain, palpitations and leg swelling.  Neurological: Negative for dizziness and headaches.  Psychiatric/Behavioral: Negative.      Today's Vitals   08/10/19 1041  BP: 110/76  Pulse: 72  Temp: 98.2 F (36.8 C)  Weight: 103 lb 3.2 oz  (46.8 kg)  Height: 5' 1.6" (1.565 m)  PainSc: 0-No pain   Body mass index is 19.12 kg/m.   Objective:  Physical Exam Vitals reviewed.  Constitutional:      General: She is not in acute distress.    Appearance: Normal appearance.  Cardiovascular:     Rate and Rhythm: Normal rate and regular rhythm.     Pulses: Normal pulses.     Heart sounds: Normal heart sounds. No murmur.  Pulmonary:     Effort: Pulmonary effort is normal. No respiratory distress.     Breath sounds: Normal breath sounds. No wheezing.  Skin:    Capillary Refill: Capillary refill takes less than 2 seconds.  Neurological:     General: No focal deficit present.     Mental Status: She is alert and oriented to person, place, and time.  Psychiatric:        Mood and Affect: Mood normal.        Behavior: Behavior normal.        Thought Content: Thought content normal.        Judgment: Judgment normal.         Assessment And Plan:     1. Cough  She was negative for covid   GERD vs cough related to allergies  She is advised to take the zyrtec at  bedtime to see if is effective.   Also discussed not eating late a night and to sit up at least 30 minutes to an hour after eating.    Minette Brine, FNP    THE PATIENT IS ENCOURAGED TO PRACTICE SOCIAL DISTANCING DUE TO THE COVID-19 PANDEMIC.

## 2019-09-11 ENCOUNTER — Encounter: Payer: Self-pay | Admitting: Nurse Practitioner

## 2019-09-21 ENCOUNTER — Ambulatory Visit: Payer: BC Managed Care – PPO | Admitting: Nurse Practitioner

## 2019-10-18 ENCOUNTER — Ambulatory Visit: Payer: BC Managed Care – PPO | Admitting: Nurse Practitioner

## 2019-10-18 ENCOUNTER — Other Ambulatory Visit: Payer: Self-pay

## 2019-10-18 ENCOUNTER — Encounter: Payer: Self-pay | Admitting: Nurse Practitioner

## 2019-10-18 VITALS — BP 118/74 | HR 65 | Temp 98.0°F | Ht 61.6 in | Wt 103.6 lb

## 2019-10-18 DIAGNOSIS — K5900 Constipation, unspecified: Secondary | ICD-10-CM

## 2019-10-18 NOTE — Patient Instructions (Addendum)
   Benefiber over the counter take 1 tsp add to water or juice in 8 oz or can get the chewables every morning.   Get an over the counter probiotic (Natures made, Natures bounty or any brand if fine) if you do not get IB [place].     Eat at least 2-3 servings a day   Drink at least 64 oz water a day  Cut back on pasta, rices, breads.

## 2019-10-18 NOTE — Progress Notes (Signed)
I,Yamilka Roman [employer] as a Neurosurgeon for [employer], FNP.,have documented all relevant documentation on the behalf of Arnette Felts, FNP,as directed by  Arnette Felts, FNP while in the presence of Arnette Felts, FNP. This visit occurred during the SARS-CoV-2 public health emergency.  Safety protocols were in place, including screening questions prior to the visit, additional usage of staff PPE, and extensive cleaning of exam room while observing appropriate contact time as indicated for disinfecting solutions.  Subjective:     Patient ID: Selena Wallace , female    DOB: 2001-02-24 , 19 y.o.   MRN: [id number]   Chief Complaint  Patient presents with  . Constipation    patient stated she has been straining to use the bathroom     HPI  Continues to have difficulty with getting her stool out, states "it is hard to push the stool out"     Constipation This is a chronic problem. The current episode started more than 1 month ago. The patient is not on a high fiber diet. She does not exercise regularly. There has been adequate water intake. Associated symptoms include abdominal pain. Pertinent negatives include no back pain, diarrhea, flatus, nausea or vomiting. Associated symptoms comments: Hard stool.  Abdominal Pain This is a new problem. The current episode started in the past 7 days. The onset quality is sudden. Pain location: mid epigastric region. Quality: cramping. Associated symptoms include constipation and dysuria. Pertinent negatives include no diarrhea, flatus, headaches, nausea or vomiting. She has tried antacids for the symptoms.  Dysuria  Pertinent negatives include no nausea or vomiting.     Past Medical History:  Diagnosis Date  . Asthma      History reviewed. No pertinent family history.   Current Outpatient Medications:  .  cetirizine (ZYRTEC ALLERGY) 10 MG tablet, Take 1 tablet (10 mg total) by mouth daily., Disp: 30 tablet, Rfl: 11 .  levonorgestrel (MIRENA,  52 MG,) 20 MCG/24HR IUD, 1 each by Intrauterine route once., Disp: , Rfl:  .  naproxen (NAPROSYN) 500 MG tablet, Take 1 tablet (500 mg total) by mouth 2 (two) times daily., Disp: 30 tablet, Rfl: 0   No Known Allergies   Review of Systems  Gastrointestinal: Positive for abdominal pain and constipation. Negative for diarrhea, flatus, nausea and vomiting.  Genitourinary: Positive for dysuria.  Musculoskeletal: Negative for back pain.  Neurological: Negative for headaches.     Today's Vitals   10/18/19 1552  BP: 118/74  Pulse: 65  Temp: 98 F (36.7 C)  TempSrc: Oral  Weight: 103 lb 9.6 oz (47 kg)  Height: 5' 1.6" (1.565 m)  PainSc: 0-No pain   Body mass index is 19.2 kg/m.   Objective:  Physical Exam Vitals reviewed.  Constitutional:      General: She is not in acute distress.    Appearance: Normal appearance.  Cardiovascular:     Rate and Rhythm: Normal rate and regular rhythm.     Pulses: Normal pulses.     Heart sounds: Normal heart sounds. No murmur heard.   Pulmonary:     Effort: Pulmonary effort is normal. No respiratory distress.     Breath sounds: Normal breath sounds. No wheezing.  Abdominal:     General: Abdomen is flat. Bowel sounds are normal. There is no distension.     Palpations: Abdomen is soft. There is no mass.     Tenderness: There is no abdominal tenderness. There is no guarding.  Skin:    Capillary Refill: Capillary  refill takes less than 2 seconds.  Neurological:     General: No focal deficit present.     Mental Status: She is alert and oriented to person, place, and time.  Psychiatric:        Mood and Affect: Mood normal.        Behavior: Behavior normal.        Thought Content: Thought content normal.        Judgment: Judgment normal.         Assessment And Plan:     1. Constipation, unspecified constipation type  She is encouraged to increase her water intake to at least 64 oz per day, increase her fiber intake. She is to add  benefiber to her supplements daily and take an over the counter probiotic. Decrease her intake of pastas, rice and breads    Patient was given opportunity to ask questions. Patient verbalized understanding of the plan and was able to repeat key elements of the plan. All questions were answered to their satisfaction.  Arnette Felts, FNP   I, Arnette Felts, FNP, have reviewed all documentation for this visit. The documentation on 10/30/19 for the exam, diagnosis, procedures, and orders are all accurate and complete.  THE PATIENT IS ENCOURAGED TO PRACTICE SOCIAL DISTANCING DUE TO THE COVID-19 PANDEMIC.

## 2019-11-30 ENCOUNTER — Encounter: Payer: Self-pay | Admitting: Nurse Practitioner

## 2019-12-04 ENCOUNTER — Ambulatory Visit: Payer: BC Managed Care – PPO | Admitting: Nurse Practitioner

## 2020-01-16 ENCOUNTER — Encounter: Payer: Self-pay | Admitting: Nurse Practitioner

## 2020-01-16 ENCOUNTER — Other Ambulatory Visit: Payer: Self-pay

## 2020-01-16 ENCOUNTER — Ambulatory Visit: Payer: BC Managed Care – PPO | Admitting: Nurse Practitioner

## 2020-01-16 VITALS — BP 102/60 | HR 76 | Temp 98.7°F | Ht 61.8 in | Wt 102.2 lb

## 2020-01-16 DIAGNOSIS — K5900 Constipation, unspecified: Secondary | ICD-10-CM

## 2020-01-16 MED ORDER — LINACLOTIDE 72 MCG PO CAPS: 72.0000 ug | ORAL_CAPSULE | Freq: Every day | ORAL | 1 refills | Status: DC

## 2020-01-16 NOTE — Progress Notes (Signed)
This visit occurred during the SARS-CoV-2 public health emergency.  Safety protocols were in place, including screening questions prior to the visit, additional usage of staff PPE, and extensive cleaning of exam room while observing appropriate contact time as indicated for disinfecting solutions.  Subjective:     Patient ID: Selena Wallace , female    DOB: 01/22/2001 , 18 y.o.   MRN: [id number]   Chief Complaint  Patient presents with  . Constipation    HPI  Patient here for a f/u on constipation, her stool remains hard and takes while to get out She has been using benefiber and probiotic.  She is drinking 3-4 bottles of water a day.  She limits her vegetable intake. She is not consistent with eating vegetables.    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  She is going to [employer] for Pre PA.    10/24 flu 8/13, 9/3 Pfizer   Constipation This is a chronic problem. The current episode started more than 1 month ago. Her stool frequency is 1 time per day (however is difficulty to "push out"). The stool is described as firm and pellet like. The patient is not on a high fiber diet. She does not exercise regularly. There has been adequate water intake. Associated symptoms include nausea (occasional). Pertinent negatives include no abdominal pain, back pain, bloating, diarrhea or fecal incontinence. Associated symptoms comments: Hard stool. Risk factors: limited intake of vegetables. She has tried fiber for the symptoms.     Past Medical History:  Diagnosis Date  . Asthma      History reviewed. No pertinent family history.   Current Outpatient Medications:  .  levonorgestrel (MIRENA, 52 MG,) 20 MCG/24HR IUD, 1 each by Intrauterine route once., Disp: , Rfl:  .  cetirizine  (ZYRTEC ALLERGY) 10 MG tablet, Take 1 tablet (10 mg total) by mouth daily. (Patient not taking: Reported on 01/16/2020), Disp: 30 tablet, Rfl: 11 .  linaclotide (LINZESS) 72 MCG capsule, Take 1 capsule (72 mcg total) by mouth daily before breakfast., Disp: 90 capsule, Rfl: 1 .  naproxen (NAPROSYN) 500 MG tablet, Take 1 tablet (500 mg total) by mouth 2 (two) times daily. (Patient not taking: Reported on 01/16/2020), Disp: 30 tablet, Rfl: 0   No Known Allergies   Review of Systems  Gastrointestinal: Positive for constipation and nausea (occasional). Negative for abdominal pain, bloating and diarrhea.  Musculoskeletal: Negative for back pain.     Today's Vitals   01/16/20 1519  BP: 102/60  Pulse: 76  Temp: 98.7 F (37.1 C)  TempSrc: Oral  Weight: 102 lb 3.2 oz (46.4 kg)  Height: 5' 1.8" (1.57 m)  PainSc: 0-No pain   Body mass index is 18.81 kg/m.   Objective:  Physical Exam Vitals reviewed.  Constitutional:      General: She is not in acute distress.    Appearance: Normal appearance.  Cardiovascular:     Rate and Rhythm: Normal rate and regular rhythm.     Pulses: Normal pulses.     Heart sounds: Normal heart sounds. No murmur heard.   Pulmonary:     Effort: Pulmonary effort is normal. No respiratory distress.     Breath sounds: Normal breath sounds. No  wheezing.  Abdominal:     General: Abdomen is flat. Bowel sounds are normal. There is no distension.     Palpations: Abdomen is soft. There is no mass.     Tenderness: There is no abdominal tenderness. There is no guarding.  Skin:    Capillary Refill: Capillary refill takes less than 2 seconds.  Neurological:     General: No focal deficit present.     Mental Status: She is alert and oriented to person, place, and time.  Psychiatric:        Mood and Affect: Mood normal.        Behavior: Behavior normal.        Thought Content: Thought content normal.        Judgment: Judgment normal.         Assessment And Plan:      1. Constipation, unspecified constipation type  this is not improving according to the patient with over the counter treatment, I have given her samples of Linzess pending insurance approval. If not covered will refer to GI to see any additional options  Patient was given opportunity to ask questions. Patient verbalized understanding of the plan and was able to repeat key elements of the plan. All questions were answered to their satisfaction.    Jeanell Sparrow, FNP, have reviewed all documentation for this visit. The documentation on 01/21/20 for the exam, diagnosis, procedures, and orders are all accurate and complete.  THE PATIENT IS ENCOURAGED TO PRACTICE SOCIAL DISTANCING DUE TO THE COVID-19 PANDEMIC.

## 2020-02-20 ENCOUNTER — Encounter: Payer: Self-pay | Admitting: Nurse Practitioner

## 2020-02-27 ENCOUNTER — Other Ambulatory Visit: Payer: Self-pay

## 2020-02-27 ENCOUNTER — Ambulatory Visit: Payer: BC Managed Care – PPO | Admitting: Nurse Practitioner

## 2020-02-27 ENCOUNTER — Encounter: Payer: Self-pay | Admitting: Nurse Practitioner

## 2020-02-27 VITALS — BP 98/64 | HR 61 | Temp 97.8°F | Ht 61.8 in | Wt 102.8 lb

## 2020-02-27 DIAGNOSIS — Z1159 Encounter for screening for other viral diseases: Secondary | ICD-10-CM | POA: Diagnosis not present

## 2020-02-27 DIAGNOSIS — K5900 Constipation, unspecified: Secondary | ICD-10-CM | POA: Diagnosis not present

## 2020-02-27 MED ORDER — ALBUTEROL SULFATE 108 (90 BASE) MCG/ACT IN AEPB: 2.0000 | INHALATION_SPRAY | Freq: Four times a day (QID) | RESPIRATORY_TRACT | 5 refills | Status: DC | PRN

## 2020-02-27 NOTE — Progress Notes (Signed)
I,Katawbba Wiggins,acting as a Neurosurgeon for [employer], FNP.,have documented all relevant documentation on the behalf of Arnette Felts, FNP,as directed by  Arnette Felts, FNP while in the presence of Arnette Felts, FNP.  This visit occurred during the SARS-CoV-2 public health emergency.  Safety protocols were in place, including screening questions prior to the visit, additional usage of staff PPE, and extensive cleaning of exam room while observing appropriate contact time as indicated for disinfecting solutions.  Subjective:     Patient ID: Selena Wallace , female    DOB: [birth year] , 19 y.o.   MRN: [id number]   Chief Complaint  Patient presents with  . Constipation    HPI  Patient here for a f/u on constipation.  The patient was started on Linzess 72 mcg at her last visit.  When she started the linzess she had good results then ran out of medication, since this and eating better still having a good bowel movement daily.  She is eating more vegetables 2 times a day.  She is exercising more as well. Still having challenges with drinking adequate amounts of water.   Constipation This is a chronic problem. The current episode started more than 1 month ago. Her stool frequency is 1 time per day (however is difficulty to "push out"). The stool is described as firm and pellet like. The patient is not on a high fiber diet. She does not exercise regularly. There has been adequate water intake. Associated symptoms include nausea (occasional). Pertinent negatives include no abdominal pain, back pain, bloating, diarrhea or fecal incontinence. Associated symptoms comments: Hard stool. Risk factors: limited intake of vegetables. She has tried fiber for the symptoms.     Past Medical History:  Diagnosis Date  . Asthma      History reviewed. No pertinent family history.   Current Outpatient Medications:  .  cetirizine (ZYRTEC ALLERGY) 10 MG tablet, Take 1 tablet (10 mg total) by mouth daily., Disp: 30  tablet, Rfl: 11 .  levonorgestrel (MIRENA, 52 MG,) 20 MCG/24HR IUD, 1 each by Intrauterine route once., Disp: , Rfl:  .  linaclotide (LINZESS) 72 MCG capsule, Take 1 capsule (72 mcg total) by mouth daily before breakfast., Disp: 90 capsule, Rfl: 1 .  naproxen (NAPROSYN) 500 MG tablet, Take 1 tablet (500 mg total) by mouth 2 (two) times daily., Disp: 30 tablet, Rfl: 0 .  Albuterol Sulfate (PROAIR RESPICLICK) 108 (90 Base) MCG/ACT AEPB, Take 2 puffs by nebulization every 6 (six) hours as needed., Disp: 1 each, Rfl: 5   No Known Allergies   Review of Systems  Constitutional: Negative.   Respiratory: Negative.   Cardiovascular: Negative.   Gastrointestinal: Positive for constipation and nausea (occasional). Negative for abdominal pain, bloating and diarrhea.  Musculoskeletal: Negative for back pain.  Psychiatric/Behavioral: Negative.   All other systems reviewed and are negative.    Today's Vitals   02/27/20 1540  BP: 98/64  Pulse: 61  Temp: 97.8 F (36.6 C)  TempSrc: Oral  Weight: 102 lb 12.8 oz (46.6 kg)  Height: 5' 1.8" (1.57 m)   Body mass index is 18.92 kg/m.  Wt Readings from Last 3 Encounters:  02/27/20 102 lb 12.8 oz (46.6 kg)  01/16/20 102 lb 3.2 oz (46.4 kg)  10/18/19 103 lb 9.6 oz (47 kg)   Objective:  Physical Exam Vitals and nursing note reviewed.  Constitutional:      Appearance: Normal appearance. She is obese.  HENT:     Head: Normocephalic and atraumatic.  Cardiovascular:     Rate and Rhythm: Normal rate and regular rhythm.     Heart sounds: Normal heart sounds.  Pulmonary:     Breath sounds: Normal breath sounds.  Skin:    General: Skin is warm.  Neurological:     General: No focal deficit present.     Mental Status: She is alert and oriented to person, place, and time.         Assessment And Plan:     1. Constipation, unspecified constipation type  She is much better after taking linzess, when she ran out she was still regular with eating  more vegetables and is still working on drinking more water.   2. Encounter for hepatitis C screening test for low risk patient  Will check Hepatitis C screening due to recent recommendations to screen all adults 18 years and older - Hepatitis C antibody     Patient was given opportunity to ask questions. Patient verbalized understanding of the plan and was able to repeat key elements of the plan. All questions were answered to their satisfaction.  Arnette Felts, FNP    I, Arnette Felts, FNP, have reviewed all documentation for this visit. The documentation on 03/03/20 for the exam, diagnosis, procedures, and orders are all accurate and complete.  THE PATIENT IS ENCOURAGED TO PRACTICE SOCIAL DISTANCING DUE TO THE COVID-19 PANDEMIC

## 2020-03-03 ENCOUNTER — Encounter: Payer: Self-pay | Admitting: Nurse Practitioner

## 2020-03-12 ENCOUNTER — Other Ambulatory Visit: Payer: Self-pay

## 2020-03-12 MED ORDER — ALBUTEROL SULFATE HFA 108 (90 BASE) MCG/ACT IN AERS: INHALATION_SPRAY | RESPIRATORY_TRACT | 2 refills | Status: DC

## 2020-04-19 ENCOUNTER — Other Ambulatory Visit: Payer: Self-pay

## 2020-04-19 ENCOUNTER — Emergency Department
Admission: EM | Admit: 2020-04-19 | Discharge: 2020-04-20 | Disposition: A | Discharge status: Home / Self Care | Payer: BC Managed Care – PPO | Attending: Emergency Medicine | Admitting: Emergency Medicine

## 2020-04-19 ENCOUNTER — Emergency Department: Payer: BC Managed Care – PPO

## 2020-04-19 DIAGNOSIS — J45909 Unspecified asthma, uncomplicated: Secondary | ICD-10-CM | POA: Insufficient documentation

## 2020-04-19 DIAGNOSIS — R0789 Other chest pain: Secondary | ICD-10-CM | POA: Diagnosis not present

## 2020-04-19 DIAGNOSIS — G44209 Tension-type headache, unspecified, not intractable: Secondary | ICD-10-CM | POA: Diagnosis not present

## 2020-04-19 DIAGNOSIS — R0602 Shortness of breath: Secondary | ICD-10-CM | POA: Insufficient documentation

## 2020-04-19 DIAGNOSIS — R42 Dizziness and giddiness: Secondary | ICD-10-CM | POA: Diagnosis not present

## 2020-04-19 DIAGNOSIS — R519 Headache, unspecified: Secondary | ICD-10-CM | POA: Diagnosis present

## 2020-04-19 LAB — CBC WITH DIFFERENTIAL/PLATELET
Abs Immature Granulocytes: 0.03 10*3/uL (ref 0.00–0.07)
Basophils Absolute: 0.1 10*3/uL (ref 0.0–0.1)
Basophils Relative: 1 %
Eosinophils Absolute: 0.1 10*3/uL (ref 0.0–0.5)
Eosinophils Relative: 1 %
HCT: 39.2 % (ref 36.0–46.0)
Hemoglobin: 12.9 g/dL (ref 12.0–15.0)
Immature Granulocytes: 0 %
Lymphocytes Relative: 27 %
Lymphs Abs: 2.4 10*3/uL (ref 0.7–4.0)
MCH: 29.1 pg (ref 26.0–34.0)
MCHC: 32.9 g/dL (ref 30.0–36.0)
MCV: 88.5 fL (ref 80.0–100.0)
Monocytes Absolute: 0.4 10*3/uL (ref 0.1–1.0)
Monocytes Relative: 4 %
Neutro Abs: 6 10*3/uL (ref 1.7–7.7)
Neutrophils Relative %: 67 %
Platelets: 236 10*3/uL (ref 150–400)
RBC: 4.43 MIL/uL (ref 3.87–5.11)
RDW: 13.1 % (ref 11.5–15.5)
WBC: 8.9 10*3/uL (ref 4.0–10.5)
nRBC: 0 % (ref 0.0–0.2)

## 2020-04-19 LAB — TROPONIN I (HIGH SENSITIVITY): Troponin I (High Sensitivity): 3 ng/L (ref <18)

## 2020-04-19 LAB — COMPREHENSIVE METABOLIC PANEL
ALT: 15 U/L (ref 0–44)
AST: 24 U/L (ref 15–41)
Albumin: 4.5 g/dL (ref 3.5–5.0)
Alkaline Phosphatase: 58 U/L (ref 38–126)
Anion gap: 6 (ref 5–15)
BUN: 11 mg/dL (ref 6–20)
CO2: 28 mmol/L (ref 22–32)
Calcium: 9.2 mg/dL (ref 8.9–10.3)
Chloride: 105 mmol/L (ref 98–111)
Creatinine, Ser: 0.69 mg/dL (ref 0.44–1.00)
GFR, Estimated: >60 mL/min (ref >60)
Glucose, Bld: 111 mg/dL — ABNORMAL HIGH (ref 70–99)
Potassium: 3.3 mmol/L — ABNORMAL LOW (ref 3.5–5.1)
Sodium: 139 mmol/L (ref 135–145)
Total Bilirubin: 1.1 mg/dL (ref 0.3–1.2)
Total Protein: 7.4 g/dL (ref 6.5–8.1)

## 2020-04-19 NOTE — ED Triage Notes (Addendum)
Patient reports she felt off most of the day but was able to finish her shift at work, closer to end of shift thought it might be her sugar so she ate some peanut butter and drank some juice and finished her shift.  Patient reports that she feel disoriented and that she is having difficulty speaking.  Patient also reports she has an appointment with her PCP for headaches and dizziness that has been on going for awhile.  Patient is speaking in complete, coherent sentences without difficulty.

## 2020-04-19 NOTE — ED Provider Notes (Signed)
[facility] Emergency Department Provider Note  ____________________________________________  Time seen: Approximately 11:54 PM  I have reviewed the triage vital signs and the nursing notes.   HISTORY  Chief Complaint Shortness of Breath   HPI Selena Wallace is a 20 y.o. female with a history of asthma who presents for evaluation of several complaints.  Patient reports that over the last several months she has had almost daily headaches that she describes as pressure frontal headaches.  She reports that she usually does not wake up with a headache.  She has a badminton class in the morning and usually develops right headaches right after that class.  The headaches usually stay with her for most of the day.  They are associated with brief episodes of dizziness.  Patient has not been seen for these headaches.  She takes over-the-counter migraine medication which usually helps.  Yesterday while sitting in class that she had a headache and had some difficulty breathing.  She reports that she felt pressure on the left lower part of her chest and then made her feel short of breath.  They will last several seconds and resolve without intervention.  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  This episode also lasted just a few seconds.  At this time she has no chest pain, no shortness of breath, no headache.  She has had no cough or fever, no malaise or body aches, no vomiting or diarrhea.  No personal or family history of PE or DVT, no recent travel immobilization, no hemoptysis, no leg pain or swelling.  Patient did have a Covid test a couple days ago which was negative.  No changes in vision, no slurred speech, no difficult finding words, no unilateral weakness or numbness, no gait instability.  No personal or family history of  aneurysms or brain cancer  or migraines.  Patient does report being under a lot of stress with school and work.  She denies any visual aura or nausea associated with her headaches.  Past Medical History:  Diagnosis Date  . Asthma     Patient Active Problem List   Diagnosis Date Noted  . Allergic rhinitis 06/06/2018  . Sore throat 06/06/2018  . Dysmenorrhea 03/16/2018  . Excessive menstruation at puberty 03/16/2018  . Oral contraception initial prescription 03/16/2018  . History of asthma 03/16/2018    No past surgical history on file.  Prior to Admission medications   Medication Sig Start Date End Date Taking? Authorizing Provider  albuterol (VENTOLIN HFA) 108 (90 Base) MCG/ACT inhaler Take 2 puffs by nebulization every 6 (six) hours as needed 03/12/20   Arnette Felts, FNP  cetirizine (ZYRTEC ALLERGY) 10 MG tablet Take 1 tablet (10 mg total) by mouth daily. 12/08/18   Wallis Bamberg, PA-C  levonorgestrel (MIRENA, 52 MG,) 20 MCG/24HR IUD 1 each by Intrauterine route once.    [provider]  linaclotide Karlene Einstein) 72 MCG capsule Take 1 capsule (72 mcg total) by mouth daily before breakfast. 01/16/20   Arnette Felts, FNP  naproxen (NAPROSYN) 500 MG tablet Take 1 tablet (500 mg total) by mouth 2 (two) times daily. 12/08/18   Wallis Bamberg, PA-C    Allergies Patient has no known allergies.  No family history on file.  Social History Social History   Tobacco Use  .  Smoking status: Never Smoker  . Smokeless tobacco: Never Used  Substance Use Topics  . Alcohol use: Never  . Drug use: Never    Review of Systems  Constitutional: Negative for fever. Eyes: Negative for visual changes. ENT: Negative for sore throat. Neck: No neck pain  Cardiovascular: + chest pain. Respiratory: + shortness of breath. Gastrointestinal: Negative for abdominal pain, vomiting or diarrhea. Genitourinary: Negative for dysuria. Musculoskeletal: Negative for back pain. Skin: Negative for  rash. Neurological: Negative for weakness or numbness. + HA Psych: No SI or HI  ____________________________________________   PHYSICAL EXAM:  VITAL SIGNS: ED Triage Vitals  Enc Vitals Group     BP 04/19/20 1914 119/78     Pulse Rate 04/19/20 1914 71     Resp 04/19/20 1914 18     Temp 04/19/20 1914 97.9 F (36.6 C)     Temp Source 04/19/20 1914 Oral     SpO2 04/19/20 1914 100 %     Weight 04/19/20 1915 101 lb (45.8 kg)     Height 04/19/20 1915 5\' 1"  (1.549 m)     Head Circumference --      Peak Flow --      Pain Score 04/19/20 2042 0     Pain Loc --      Pain Edu? --      Excl. in GC? --     Constitutional: Alert and oriented. Well appearing and in no apparent distress. HEENT:      Head: Normocephalic and atraumatic.         Eyes: Conjunctivae are normal. Sclera is non-icteric. PERRL, EOMI      Mouth/Throat: Mucous membranes are moist.       Neck: Supple with no signs of meningismus. Cardiovascular: Regular rate and rhythm. No murmurs, gallops, or rubs. 2+ symmetrical distal pulses are present in all extremities. No JVD. Respiratory: Normal respiratory effort. Lungs are clear to auscultation bilaterally. No wheezes, crackles, or rhonchi.  Gastrointestinal: Soft, non tender, and non distended with positive bowel sounds. No rebound or guarding. Genitourinary: No CVA tenderness. Musculoskeletal:  No edema, cyanosis, or erythema of extremities. Neurologic: Normal speech and language. Face is symmetric.  Intact strength and sensation x4, normal gait, no pronator drift, no dysmetria, no nystagmus. Skin: Skin is warm, dry and intact. No rash noted. Psychiatric: Mood and affect are normal. Speech and behavior are normal.  ____________________________________________   LABS (all labs ordered are listed, but only abnormal results are displayed)  Labs Reviewed  COMPREHENSIVE METABOLIC PANEL - Abnormal; Notable for the following components:      Result Value   Potassium 3.3  (*)    Glucose, Bld 111 (*)    All other components within normal limits  CBC WITH DIFFERENTIAL/PLATELET  TROPONIN I (HIGH SENSITIVITY)  TROPONIN I (HIGH SENSITIVITY)   ____________________________________________  EKG  ED ECG REPORT I, 2043, the attending physician, personally viewed and interpreted this ECG.  Normal sinus rhythm, rate of 68, normal intervals, normal axis, no ST elevations or depressions. ____________________________________________  RADIOLOGY  I have personally reviewed the images performed during this visit and I agree with the Radiologist's read.   Interpretation by Radiologist:  DG Chest 2 View  Result Date: 04/19/2020 CLINICAL DATA:  Patient reports she felt off most of the day but was able to finish her shift at work, closer to end of shift thought it might be her sugar so she ate some peanut butter and drank some juice and finished her shift.  Patient reports that she feel disoriented and that she is having difficulty speaking. Patient also reports she has an appointment with her PCP for headaches and dizziness that has been on going for awhile. EXAM: CHEST - 2 VIEW COMPARISON:  None. FINDINGS: Normal heart, mediastinum and hila. Clear lungs. No pleural effusion or pneumothorax. Skeletal structures are within normal limits. IMPRESSION: Normal chest radiographs. Electronically Signed   By: Amie Portland M.D.   On: 04/19/2020 19:44     ____________________________________________   PROCEDURES  Procedure(s) performed:yes .1-3 Lead EKG Interpretation Performed by: Nita Sickle, MD Authorized by: Nita Sickle, MD     Interpretation: normal     ECG rate assessment: normal     Rhythm: sinus rhythm     Ectopy: none     Critical Care performed:  None ____________________________________________   INITIAL IMPRESSION / ASSESSMENT AND PLAN / ED COURSE   20 y.o. female with a history of asthma who presents for evaluation of daily  HAs, dizziness, CP, SOB.  All symptoms have now resolved.  Patient is extremely well-appearing and in no distress with normal vital signs, neurologically intact.  EKG with no signs of acute ischemia or dysrhythmias.  Chest x-ray visualized by me with no signs of pneumonia, pneumothorax, edema, confirmed by radiology.  Labs with no signs of dehydration, significant electrolyte derangements, leukocytosis, cardiac ischemia.  Patient has been tested for Covid recently which was negative.  Low suspicion for more serious or life threatening etiology of HA based on history and exam. No sudden onset thunderclap HA, onset with exertion, vomiting, focal neurologic deficits, to suggest increased risk of subarachnoid hemorrhage. No fever, neck pain, neck stiffness, or meningismus on exam to suggest meningitis. No fevers, altered mental status, unusual behavior to suggest encephalitis. No focal neurologic deficits by history or exam to suggest central venous thrombosis. No constitutional symptoms including fever, fatigue, weight loss, temporal scalp tenderness, jaw claudication, visual loss, to suggest temporal arteritis. No immunocompromise to suggest increased risk for intracranial infectious disease. No visual changes or findings on ocular exam to suggest acute angle closure glaucoma. No reports of toxic exposures including carbon monoxide or other household members with similar symptoms. Most likely tension headaches. Recommended f/u with PCP for further evaluation. No indication for CT at this time.  Atypical short lived chest tightness lasting a few seconds. Low suspicion for ACS or PE given history, age, lack of comorbidities, no tachypnea, no tachycardia, no hypoxia, negative troponins and EKG with no signs of ischemia.  Patient does have asthma.  Recommended using her inhaler and close follow-up with PCP       _____________________________________________ Please note:  Patient was evaluated in Emergency  Department today for the symptoms described in the history of present illness. Patient was evaluated in the context of the global COVID-19 pandemic, which necessitated consideration that the patient might be at risk for infection with the SARS-CoV-2 virus that causes COVID-19. Institutional protocols and algorithms that pertain to the evaluation of patients at risk for COVID-19 are in a state of rapid change based on information released by regulatory bodies including the CDC and federal and state organizations. These policies and algorithms were followed during the patient's care in the ED.  Some ED evaluations and interventions may be delayed as a result of limited staffing during the pandemic.   [place] Controlled Substance Database was reviewed by me. ____________________________________________   FINAL CLINICAL IMPRESSION(S) / ED DIAGNOSES   Final diagnoses:  Tension headache  Atypical chest pain  NEW MEDICATIONS STARTED DURING THIS VISIT:  ED Discharge Orders    None       Note:  This document was prepared using Dragon voice recognition software and may include unintentional dictation errors.    Don Perking, [place], MD 04/20/20 [id number]

## 2020-04-20 LAB — TROPONIN I (HIGH SENSITIVITY): Troponin I (High Sensitivity): <2 ng/L (ref <18)

## 2020-04-22 ENCOUNTER — Encounter: Payer: BC Managed Care – PPO | Admitting: Nurse Practitioner

## 2020-04-22 ENCOUNTER — Other Ambulatory Visit: Payer: Self-pay

## 2020-04-25 ENCOUNTER — Other Ambulatory Visit: Payer: Self-pay

## 2020-04-25 ENCOUNTER — Ambulatory Visit: Payer: BC Managed Care – PPO | Admitting: Nurse Practitioner

## 2020-04-25 ENCOUNTER — Encounter: Payer: Self-pay | Admitting: Nurse Practitioner

## 2020-04-25 VITALS — BP 110/70 | HR 75 | Temp 98.2°F | Ht 61.0 in | Wt 102.0 lb

## 2020-04-25 DIAGNOSIS — R519 Headache, unspecified: Secondary | ICD-10-CM

## 2020-04-25 MED ORDER — MAGNESIUM 250 MG PO TABS: 1.0000 | ORAL_TABLET | Freq: Every day | ORAL | 1 refills | Status: DC

## 2020-04-25 MED ORDER — SUMATRIPTAN SUCCINATE 50 MG PO TABS: 50.0000 mg | ORAL_TABLET | Freq: Once | ORAL | 2 refills | Status: DC | PRN

## 2020-04-25 NOTE — Progress Notes (Signed)
I,Yamilka Roman [employer] as a Neurosurgeon for [employer], FNP.,have documented all relevant documentation on the behalf of Arnette Felts, FNP,as directed by  Arnette Felts, FNP while in the presence of Arnette Felts, FNP. This visit occurred during the SARS-CoV-2 public health emergency.  Safety protocols were in place, including screening questions prior to the visit, additional usage of staff PPE, and extensive cleaning of exam room while observing appropriate contact time as indicated for disinfecting solutions.  Subjective:     Patient ID: Selena Wallace , female    DOB: [birth year] , 20 y.o.   MRN: [id number]   Chief Complaint  Patient presents with  . Headache    Patient stated her headaches have been worsening she stated she has a headache everyday now. She also feels dizzy when she has a headache.    HPI  Patient presents today for a eval on her headaches. She stated she use to have them once a week but now she is having them everyday. She stated she has been having dizziness along with it.  She laid down that night and improved but would continue to have chest pressure when she took a breath. Went to work the next day, had similar symptoms with headache, dizziness and could not breathe. She has been taking excedrin over the last month has taken 4 times.  Feels like pressure at the temples. The light will bother her eyes or "pull at her ear". She has Mirena IUD she has had for one year, feels like is more level not having heavy bleeding.  She is working 2 days a week and working 2 days a week.  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  She does not take ibuprofen.  She feels she is doing better with her water intake.   Headache  This is a new problem. The current episode started in the past 7 days (Friday). The problem has been gradually improving. Associated symptoms include dizziness (feels like room is spinning slightly, sometimes when  she is walking). The symptoms are aggravated by bright light. Treatments tried: excedrin migraine. There is no history of hypertension.     Past Medical History:  Diagnosis Date  . Asthma      History reviewed. No pertinent family history.   Current Outpatient Medications:  .  albuterol (VENTOLIN HFA) 108 (90 Base) MCG/ACT inhaler, Take 2 puffs by nebulization every 6 (six) hours as needed, Disp: 1 each, Rfl: 2 .  levonorgestrel (MIRENA, 52 MG,) 20 MCG/24HR IUD, 1 each by Intrauterine route once., Disp: , Rfl:  .  linaclotide (LINZESS) 72 MCG capsule, Take 1 capsule (72 mcg total) by mouth daily before breakfast., Disp: 90 capsule, Rfl: 1 .  Magnesium 250 MG TABS, Take 1 tablet (250 mg total) by mouth daily. With evening meal, Disp: 90 tablet, Rfl: 1 .  naproxen (NAPROSYN) 500 MG tablet, Take 1 tablet (500 mg total) by mouth 2 (two) times daily., Disp: 30 tablet, Rfl: 0 .  SUMAtriptan (IMITREX) 50 MG tablet, Take 1 tablet (50 mg total) by mouth once as needed for migraine. May repeat in 2 hours if headache persists or recurs., Disp: 10 tablet, Rfl: 2 .  cetirizine (ZYRTEC ALLERGY) 10 MG tablet, Take 1 tablet (10 mg total) by mouth daily. (Patient not taking: Reported on 04/25/2020), Disp: 30 tablet, Rfl: 11   No Known Allergies   Review of Systems  Constitutional: Negative.   HENT: Negative.  Eyes: Negative.   Respiratory: Negative.   Cardiovascular: Negative.   Gastrointestinal: Negative.   Endocrine: Negative.   Genitourinary: Negative.   Musculoskeletal: Negative.   Skin: Negative.   Neurological: Positive for dizziness (feels like room is spinning slightly, sometimes when she is walking) and headaches.  Hematological: Negative.   Psychiatric/Behavioral: Negative.      Today's Vitals   04/25/20 1608  BP: 110/70  Pulse: 75  Temp: 98.2 F (36.8 C)  TempSrc: Oral  Weight: 102 lb (46.3 kg)  Height: 5\' 1"  (1.549 m)  PainSc: 0-No pain   Body mass index is 19.27 kg/m.    Objective:  Physical Exam Constitutional:      General: She is not in acute distress.    Appearance: She is well-developed and normal weight.  Cardiovascular:     Rate and Rhythm: Normal rate and regular rhythm.     Heart sounds: Normal heart sounds.  Pulmonary:     Effort: Pulmonary effort is normal.     Breath sounds: Normal breath sounds.  Abdominal:     General: Bowel sounds are normal.     Palpations: Abdomen is soft.  Musculoskeletal:        General: Normal range of motion.  Skin:    General: Skin is warm and dry.     Capillary Refill: Capillary refill takes less than 2 seconds.  Neurological:     Mental Status: She is alert.  Psychiatric:        Mood and Affect: Mood normal.        Speech: Speech normal.        Behavior: Behavior normal.         Assessment And Plan:     1. Acute nonintractable headache, unspecified headache type  This has been ongoing and resembles a migraine, will treat with imitrex and she is to take magnesium nightly.    She is also under increased stress.  - Magnesium 250 MG TABS; Take 1 tablet (250 mg total) by mouth daily. With evening meal  Dispense: 90 tablet; Refill: 1 - SUMAtriptan (IMITREX) 50 MG tablet; Take 1 tablet (50 mg total) by mouth once as needed for migraine. May repeat in 2 hours if headache persists or recurs.  Dispense: 10 tablet; Refill: 2     Patient was given opportunity to ask questions. Patient verbalized understanding of the plan and was able to repeat key elements of the plan. All questions were answered to their satisfaction.  , FNP   I, Arnette Felts, FNP, have reviewed all documentation for this visit. The documentation on 04/25/20 for the exam, diagnosis, procedures, and orders are all accurate and complete.   THE PATIENT IS ENCOURAGED TO PRACTICE SOCIAL DISTANCING DUE TO THE COVID-19 PANDEMIC.

## 2020-04-25 NOTE — Patient Instructions (Signed)
General Headache Without Cause A headache is pain or discomfort that is felt around the head or neck area. There are many causes and types of headaches. In some cases, the cause may not be found. Follow these instructions at home: Watch your condition for any changes. Let your doctor know about them. Take these steps to help with your condition: Managing pain  Take over-the-counter and prescription medicines only as told by your doctor.  Lie down in a dark, quiet room when you have a headache.  If told, put ice on your head and neck area: ? Put ice in a plastic bag. ? Place a towel between your skin and the bag. ? Leave the ice on for 20 minutes, 2-3 times per day.  If told, put heat on the affected area. Use the heat source that your doctor recommends, such as a moist heat pack or a heating pad. ? Place a towel between your skin and the heat source. ? Leave the heat on for 20-30 minutes. ? Remove the heat if your skin turns bright red. This is very important if you are unable to feel pain, heat, or cold. You may have a greater risk of getting burned.  Keep lights dim if bright lights bother you or make your headaches worse.      Eating and drinking  Eat meals on a regular schedule.  If you drink alcohol: ? Limit how much you use to:  0-1 drink a day for women.  0-2 drinks a day for men. ? Be aware of how much alcohol is in your drink. In the [place] one drink equals one 12 oz bottle of beer (355 mL), one 5 oz glass of wine (148 mL), or one 1 oz glass of hard liquor (44 mL).  Stop drinking caffeine, or reduce how much caffeine you drink. General instructions  Keep a journal to find out if certain things bring on headaches. For example, write down: ? What you eat and drink. ? How much sleep you get. ? Any change to your diet or medicines.  Get a massage or try other ways to relax.  Limit stress.  Sit up straight. Do not tighten (tense) your muscles.  Do not use any  products that contain nicotine or tobacco. This includes cigarettes, e-cigarettes, and chewing tobacco. If you need help quitting, ask your doctor.  Exercise regularly as told by your doctor.  Get enough sleep. This often means 7-9 hours of sleep each night.  Keep all follow-up visits as told by your doctor. This is important.   Contact a doctor if:  Your symptoms are not helped by medicine.  You have a headache that feels different than the other headaches.  You feel sick to your stomach (nauseous) or you throw up (vomit).  You have a fever. Get help right away if:  Your headache gets very bad quickly.  Your headache gets worse after a lot of physical activity.  You keep throwing up.  You have a stiff neck.  You have trouble seeing.  You have trouble speaking.  You have pain in the eye or ear.  Your muscles are weak or you lose muscle control.  You lose your balance or have trouble walking.  You feel like you will pass out (faint) or you pass out.  You are mixed up (confused).  You have a seizure. Summary  A headache is pain or discomfort that is felt around the head or neck area.  There are many   causes and types of headaches. In some cases, the cause may not be found.  Keep a journal to help find out what causes your headaches. Watch your condition for any changes. Let your doctor know about them.  Contact a doctor if you have a headache that is different from usual, or if your headache is not helped by medicine.  Get help right away if your headache gets very bad, you throw up, you have trouble seeing, you lose your balance, or you have a seizure. This information is not intended to replace advice given to you by your health care provider. Make sure you discuss any questions you have with your health care provider. Document Revised: 09/13/2017 Document Reviewed: 09/13/2017 Elsevier Patient Education  2021 [employer].  

## 2020-05-10 ENCOUNTER — Encounter: Payer: Self-pay | Admitting: Nurse Practitioner

## 2020-05-12 NOTE — Progress Notes (Signed)
Not seen

## 2020-05-30 ENCOUNTER — Encounter: Payer: Self-pay | Admitting: Nurse Practitioner

## 2020-06-06 ENCOUNTER — Ambulatory Visit: Payer: BC Managed Care – PPO | Admitting: Nurse Practitioner

## 2020-07-02 ENCOUNTER — Encounter: Payer: Self-pay | Admitting: Nurse Practitioner

## 2020-07-02 ENCOUNTER — Other Ambulatory Visit: Payer: Self-pay

## 2020-07-02 ENCOUNTER — Ambulatory Visit (INDEPENDENT_AMBULATORY_CARE_PROVIDER_SITE_OTHER): Payer: BC Managed Care – PPO | Admitting: Nurse Practitioner

## 2020-07-02 VITALS — BP 108/60 | HR 68 | Temp 97.9°F | Ht 61.6 in | Wt 104.6 lb

## 2020-07-02 DIAGNOSIS — Z0000 Encounter for general adult medical examination without abnormal findings: Secondary | ICD-10-CM | POA: Diagnosis not present

## 2020-07-02 DIAGNOSIS — R21 Rash and other nonspecific skin eruption: Secondary | ICD-10-CM | POA: Diagnosis not present

## 2020-07-02 MED ORDER — NYSTATIN-TRIAMCINOLONE 100000-0.1 UNIT/GM-% EX OINT: 1.0000 "application " | TOPICAL_OINTMENT | Freq: Two times a day (BID) | CUTANEOUS | 0 refills | Status: DC

## 2020-07-02 NOTE — Progress Notes (Signed)
I,Yamilka Roman [employer] as a Education administrator for [employer], FNP.,have documented all relevant documentation on the behalf of Minette Brine, FNP,as directed by  Minette Brine, FNP while in the presence of Minette Brine, [place]. This visit occurred during the SARS-CoV-2 public health emergency.  Safety protocols were in place, including screening questions prior to the visit, additional usage of staff PPE, and extensive cleaning of exam room while observing appropriate contact time as indicated for disinfecting solutions.  Subjective:     Patient ID: Selena Wallace , female    DOB: [birth year] , 20 y.o.   MRN: [id number]   Chief Complaint  Patient presents with  . Annual Exam  . Rash    HPI  Patient here for hm. She also reports having a rash on the top part of her right breast that has been there for a few weeks. She reports it is tingly and it has not changed in size. She is graduating in December 2022.  She sees Earnstine Regal at [place], has not had her PAP.     Past Medical History:  Diagnosis Date  . Asthma   . HSV-2 (herpes simplex virus 2) infection      History reviewed. No pertinent family history.   Current Outpatient Medications:  .  albuterol (VENTOLIN HFA) 108 (90 Base) MCG/ACT inhaler, Take 2 puffs by nebulization every 6 (six) hours as needed, Disp: 1 each, Rfl: 2 .  naproxen (NAPROSYN) 500 MG tablet, Take 1 tablet (500 mg total) by mouth 2 (two) times daily., Disp: 30 tablet, Rfl: 0 .  nystatin-triamcinolone ointment (MYCOLOG), Apply 1 application topically 2 (two) times daily., Disp: 30 g, Rfl: 0 .  cetirizine (ZYRTEC ALLERGY) 10 MG tablet, Take 1 tablet (10 mg total) by mouth daily. (Patient not taking: No sig reported), Disp: 30 tablet, Rfl: 11 .  SUMAtriptan (IMITREX) 50 MG tablet, Take 1 tablet (50 mg total) by mouth once as needed for migraine. May repeat in 2 hours if headache persists or recurs. (Patient not taking: Reported on 07/02/2020), Disp: 10  tablet, Rfl: 2   No Known Allergies    The patient states she uses IUD for birth control. Last LMP was Patient's last menstrual period was 06/17/2020.. Negative for Dysmenorrhea and Negative for Menorrhagia. Negative for: breast discharge, breast lump(s), breast pain and breast self exam. Associated symptoms include abnormal vaginal bleeding. Pertinent negatives include abnormal bleeding (hematology), anxiety, decreased libido, depression, difficulty falling sleep, dyspareunia, history of infertility, nocturia, sexual dysfunction, sleep disturbances, urinary incontinence, urinary urgency, vaginal discharge and vaginal itching. Diet regular; she is trying eat more healthy but finds it challenging. The patient states her exercise level is minimal - not consistently.   The patient's tobacco use is:  Social History   Tobacco Use  Smoking Status Never Smoker  Smokeless Tobacco Never Used   She has been exposed to passive smoke. The patient's alcohol use is:  Social History   Substance and Sexual Activity  Alcohol Use Never   Additional information: she has not had her first PAP at this time. She is planning to get done with her [employer] this year.     Review of Systems  Constitutional: Negative.   HENT: Negative.   Eyes: Negative.   Respiratory: Negative.   Cardiovascular: Negative.   Gastrointestinal: Negative.   Endocrine: Negative.   Genitourinary: Negative.   Musculoskeletal: Negative.   Skin: Negative.   Allergic/Immunologic: Negative.   Neurological: Negative.   Hematological: Negative.  Psychiatric/Behavioral: Negative.      Today's Vitals   07/02/20 0944  BP: 108/60  Pulse: 68  Temp: 97.9 F (36.6 C)  TempSrc: Oral  SpO2: 98%  Weight: 104 lb 9.6 oz (47.4 kg)  Height: 5' 1.6" (1.565 m)  PainSc: 0-No pain   Body mass index is 19.38 kg/m.   Objective:  Physical Exam Constitutional:      General: She is not in acute distress.    Appearance: Normal  appearance. She is well-developed.  HENT:     Head: Normocephalic and atraumatic.     Right Ear: Hearing, tympanic membrane, ear canal and external ear normal. There is no impacted cerumen.     Left Ear: Hearing, tympanic membrane, ear canal and external ear normal. There is no impacted cerumen.     Nose:     Comments: Deferred - masked    Mouth/Throat:     Comments: Deferred - masked Eyes:     General: Lids are normal.     Extraocular Movements: Extraocular movements intact.     Conjunctiva/sclera: Conjunctivae normal.     Pupils: Pupils are equal, round, and reactive to light.     Funduscopic exam:    Right eye: No papilledema.        Left eye: No papilledema.  Neck:     Thyroid: No thyroid mass.     Vascular: No carotid bruit.  Cardiovascular:     Rate and Rhythm: Normal rate and regular rhythm.     Pulses: Normal pulses.     Heart sounds: Normal heart sounds. No murmur heard.   Pulmonary:     Effort: Pulmonary effort is normal. No respiratory distress.     Breath sounds: Normal breath sounds. No wheezing.  Chest:     Chest wall: No mass.  Breasts:     Tanner Score is 5.     Right: Normal. No mass, tenderness, axillary adenopathy or supraclavicular adenopathy.     Left: Normal. No mass, tenderness, axillary adenopathy or supraclavicular adenopathy.    Abdominal:     General: Abdomen is flat. Bowel sounds are normal. There is no distension.     Palpations: Abdomen is soft.     Tenderness: There is no abdominal tenderness.  Genitourinary:    Rectum: Guaiac result negative.  Musculoskeletal:        General: No swelling or tenderness. Normal range of motion.     Cervical back: Full passive range of motion without pain, normal range of motion and neck supple.     Right lower leg: No edema.     Left lower leg: No edema.  Lymphadenopathy:     Upper Body:     Right upper body: No supraclavicular, axillary or pectoral adenopathy.     Left upper body: No supraclavicular,  axillary or pectoral adenopathy.  Skin:    General: Skin is warm and dry.     Capillary Refill: Capillary refill takes less than 2 seconds.     Coloration: Skin is not jaundiced.     Findings: Rash (she has a circular sligthly reddened raised area to her right upper breast) present.  Neurological:     General: No focal deficit present.     Mental Status: She is alert and oriented to person, place, and time.     Cranial Nerves: No cranial nerve deficit.     Sensory: No sensory deficit.     Motor: No weakness.  Psychiatric:  Mood and Affect: Mood normal.        Behavior: Behavior normal.        Thought Content: Thought content normal.        Judgment: Judgment normal.         Assessment And Plan:     1. Encounter for general adult medical examination w/o abnormal findings . Behavior modifications discussed and diet history reviewed.   . Pt will continue to exercise regularly and modify diet with low GI, plant based foods and decrease intake of processed foods.  . Recommend intake of daily multivitamin, Vitamin D, and calcium.  . Recommend for preventive screenings, as well as recommend immunizations that include influenza, TDAP, HPV up to date. Declines covid booster encouraged to remain careful and good handwashing . Educated on doing breast self exams monthly if anything of concern - CMP14+EGFR - CBC - VITAMIN D 25 Hydroxy (Vit-D Deficiency, Fractures) - Lipid panel  2. Rash and nonspecific skin eruption  Slightly reddened, circular and raised rash to right upper breast  Eczema vs ringworm  Nystatin - triamcinolone ointment sent to pharmacy  - nystatin-triamcinolone ointment ([place]); Apply 1 application topically 2 (two) times daily.  Dispense: 30 g; Refill: 0     Patient was given opportunity to ask questions. Patient verbalized understanding of the plan and was able to repeat key elements of the plan. All questions were answered to their satisfaction.    Minette Brine, FNP   I, Minette Brine, FNP, have reviewed all documentation for this visit. The documentation on 07/02/20 for the exam, diagnosis, procedures, and orders are all accurate and complete.   THE PATIENT IS ENCOURAGED TO PRACTICE SOCIAL DISTANCING DUE TO THE COVID-19 PANDEMIC.

## 2020-07-02 NOTE — Patient Instructions (Signed)
Health Maintenance, Female Adopting a healthy lifestyle and getting preventive care are important in promoting health and wellness. Ask your health care provider about:  The right schedule for you to have regular tests and exams.  Things you can do on your own to prevent diseases and keep yourself healthy. What should I know about diet, weight, and exercise? Eat a healthy diet  Eat a diet that includes plenty of vegetables, fruits, low-fat dairy products, and lean protein.  Do not eat a lot of foods that are high in solid fats, added sugars, or sodium.   Maintain a healthy weight Body mass index (BMI) is used to identify weight problems. It estimates body fat based on height and weight. Your health care provider can help determine your BMI and help you achieve or maintain a healthy weight. Get regular exercise Get regular exercise. This is one of the most important things you can do for your health. Most adults should:  Exercise for at least 150 minutes each week. The exercise should increase your heart rate and make you sweat (moderate-intensity exercise).  Do strengthening exercises at least twice a week. This is in addition to the moderate-intensity exercise.  Spend less time sitting. Even light physical activity can be beneficial. Watch cholesterol and blood lipids Have your blood tested for lipids and cholesterol at 22 years of age, then have this test every 5 years. Have your cholesterol levels checked more often if:  Your lipid or cholesterol levels are high.  You are older than 22 years of age.  You are at high risk for heart disease. What should I know about cancer screening? Depending on your health history and family history, you may need to have cancer screening at various ages. This may include screening for:  Breast cancer.  Cervical cancer.  Colorectal cancer.  Skin cancer.  Lung cancer. What should I know about heart disease, diabetes, and high blood  pressure? Blood pressure and heart disease  High blood pressure causes heart disease and increases the risk of stroke. This is more likely to develop in people who have high blood pressure readings, are of African descent, or are overweight.  Have your blood pressure checked: ? Every 3-5 years if you are 18-39 years of age. ? Every year if you are 40 years old or older. Diabetes Have regular diabetes screenings. This checks your fasting blood sugar level. Have the screening done:  Once every three years after age 40 if you are at a normal weight and have a low risk for diabetes.  More often and at a younger age if you are overweight or have a high risk for diabetes. What should I know about preventing infection? Hepatitis B If you have a higher risk for hepatitis B, you should be screened for this virus. Talk with your health care provider to find out if you are at risk for hepatitis B infection. Hepatitis C Testing is recommended for:  Everyone born from 1945 through 1965.  Anyone with known risk factors for hepatitis C. Sexually transmitted infections (STIs)  Get screened for STIs, including gonorrhea and chlamydia, if: ? You are sexually active and are younger than 22 years of age. ? You are older than 22 years of age and your health care provider tells you that you are at risk for this type of infection. ? Your sexual activity has changed since you were last screened, and you are at increased risk for chlamydia or gonorrhea. Ask your health care provider   if you are at risk.  Ask your health care provider about whether you are at high risk for HIV. Your health care provider may recommend a prescription medicine to help prevent HIV infection. If you choose to take medicine to prevent HIV, you should first get tested for HIV. You should then be tested every 3 months for as long as you are taking the medicine. Pregnancy  If you are about to stop having your period (premenopausal) and  you may become pregnant, seek counseling before you get pregnant.  Take 400 to 800 micrograms (mcg) of folic acid every day if you become pregnant.  Ask for birth control (contraception) if you want to prevent pregnancy. Osteoporosis and menopause Osteoporosis is a disease in which the bones lose minerals and strength with aging. This can result in bone fractures. If you are 65 years old or older, or if you are at risk for osteoporosis and fractures, ask your health care provider if you should:  Be screened for bone loss.  Take a calcium or vitamin D supplement to lower your risk of fractures.  Be given hormone replacement therapy (HRT) to treat symptoms of menopause. Follow these instructions at home: Lifestyle  Do not use any products that contain nicotine or tobacco, such as cigarettes, e-cigarettes, and chewing tobacco. If you need help quitting, ask your health care provider.  Do not use street drugs.  Do not share needles.  Ask your health care provider for help if you need support or information about quitting drugs. Alcohol use  Do not drink alcohol if: ? Your health care provider tells you not to drink. ? You are pregnant, may be pregnant, or are planning to become pregnant.  If you drink alcohol: ? Limit how much you use to 0-1 drink a day. ? Limit intake if you are breastfeeding.  Be aware of how much alcohol is in your drink. In the [place] one drink equals one 12 oz bottle of beer (355 mL), one 5 oz glass of wine (148 mL), or one 1 oz glass of hard liquor (44 mL). General instructions  Schedule regular health, dental, and eye exams.  Stay current with your vaccines.  Tell your health care provider if: ? You often feel depressed. ? You have ever been abused or do not feel safe at home. Summary  Adopting a healthy lifestyle and getting preventive care are important in promoting health and wellness.  Follow your health care provider's instructions about healthy  diet, exercising, and getting tested or screened for diseases.  Follow your health care provider's instructions on monitoring your cholesterol and blood pressure. This information is not intended to replace advice given to you by your health care provider. Make sure you discuss any questions you have with your health care provider. Document Revised: 02/16/2018 Document Reviewed: 02/16/2018 Elsevier Patient Education  2021 Elsevier Inc.  

## 2020-07-03 LAB — LIPID PANEL
Chol/HDL Ratio: 2 ratio (ref 0.0–4.4)
Cholesterol, Total: 169 mg/dL (ref 100–199)
HDL: 84 mg/dL (ref >39)
LDL Chol Calc (NIH): 75 mg/dL (ref 0–99)
Triglycerides: 50 mg/dL (ref 0–149)
VLDL Cholesterol Cal: 10 mg/dL (ref 5–40)

## 2020-07-03 LAB — CMP14+EGFR
ALT: 14 IU/L (ref 0–32)
AST: 20 IU/L (ref 0–40)
Albumin/Globulin Ratio: 2.2 (ref 1.2–2.2)
Albumin: 4.9 g/dL (ref 3.9–5.0)
Alkaline Phosphatase: 58 IU/L (ref 44–121)
BUN/Creatinine Ratio: 15 (ref 9–23)
BUN: 14 mg/dL (ref 6–20)
Bilirubin Total: 1.5 mg/dL — ABNORMAL HIGH (ref 0.0–1.2)
CO2: 25 mmol/L (ref 20–29)
Calcium: 10.2 mg/dL (ref 8.7–10.2)
Chloride: 101 mmol/L (ref 96–106)
Creatinine, Ser: 0.96 mg/dL (ref 0.57–1.00)
Globulin, Total: 2.2 g/dL (ref 1.5–4.5)
Glucose: 70 mg/dL (ref 65–99)
Potassium: 4.3 mmol/L (ref 3.5–5.2)
Sodium: 139 mmol/L (ref 134–144)
Total Protein: 7.1 g/dL (ref 6.0–8.5)
eGFR: 86 mL/min/{1.73_m2} (ref >59)

## 2020-07-03 LAB — CBC
Hematocrit: 42.6 % (ref 34.0–46.6)
Hemoglobin: 13.9 g/dL (ref 11.1–15.9)
MCH: 28.5 pg (ref 26.6–33.0)
MCHC: 32.6 g/dL (ref 31.5–35.7)
MCV: 87 fL (ref 79–97)
Platelets: 247 10*3/uL (ref 150–450)
RBC: 4.88 x10E6/uL (ref 3.77–5.28)
RDW: 12.1 % (ref 11.7–15.4)
WBC: 5.5 10*3/uL (ref 3.4–10.8)

## 2020-07-03 LAB — VITAMIN D 25 HYDROXY (VIT D DEFICIENCY, FRACTURES): Vit D, 25-Hydroxy: 18.3 ng/mL — ABNORMAL LOW (ref 30.0–100.0)

## 2020-07-11 ENCOUNTER — Other Ambulatory Visit: Payer: Self-pay

## 2020-07-11 ENCOUNTER — Encounter: Payer: Self-pay | Admitting: Nurse Practitioner

## 2020-07-11 DIAGNOSIS — R21 Rash and other nonspecific skin eruption: Secondary | ICD-10-CM

## 2020-07-11 MED ORDER — NYSTATIN-TRIAMCINOLONE 100000-0.1 UNIT/GM-% EX OINT: 1.0000 "application " | TOPICAL_OINTMENT | Freq: Two times a day (BID) | CUTANEOUS | 0 refills | Status: DC

## 2020-07-17 ENCOUNTER — Other Ambulatory Visit: Payer: Self-pay | Admitting: Nurse Practitioner

## 2020-07-17 DIAGNOSIS — R21 Rash and other nonspecific skin eruption: Secondary | ICD-10-CM

## 2020-07-17 MED ORDER — FLUCONAZOLE 100 MG PO TABS: 100.0000 mg | ORAL_TABLET | Freq: Every day | ORAL | 0 refills | Status: DC

## 2020-07-17 MED ORDER — CLOTRIMAZOLE-BETAMETHASONE 1-0.05 % EX CREA: TOPICAL_CREAM | CUTANEOUS | 1 refills | Status: AC

## 2021-06-03 ENCOUNTER — Ambulatory Visit (INDEPENDENT_AMBULATORY_CARE_PROVIDER_SITE_OTHER): Payer: BC Managed Care – PPO

## 2021-06-03 ENCOUNTER — Other Ambulatory Visit: Payer: Self-pay

## 2021-06-03 VITALS — BP 100/60 | HR 66 | Temp 98.2°F | Ht 61.0 in | Wt 109.0 lb

## 2021-06-03 DIAGNOSIS — Z23 Encounter for immunization: Secondary | ICD-10-CM | POA: Diagnosis not present

## 2021-06-03 DIAGNOSIS — Z111 Encounter for screening for respiratory tuberculosis: Secondary | ICD-10-CM

## 2021-06-03 NOTE — Progress Notes (Signed)
Patient presents for tdap and quantiferon for school.  ?

## 2021-06-05 LAB — QUANTIFERON-TB GOLD PLUS
QuantiFERON Mitogen Value: >10 IU/mL
QuantiFERON Nil Value: 0.03 IU/mL
QuantiFERON TB1 Ag Value: 0.07 IU/mL
QuantiFERON TB2 Ag Value: 0.04 IU/mL
QuantiFERON-TB Gold Plus: NEGATIVE

## 2021-07-07 ENCOUNTER — Ambulatory Visit (INDEPENDENT_AMBULATORY_CARE_PROVIDER_SITE_OTHER): Payer: BC Managed Care – PPO | Admitting: Nurse Practitioner

## 2021-07-07 ENCOUNTER — Encounter: Payer: Self-pay | Admitting: Nurse Practitioner

## 2021-07-07 VITALS — BP 110/62 | Temp 98.2°F | Ht 61.0 in | Wt 106.0 lb

## 2021-07-07 DIAGNOSIS — Z8709 Personal history of other diseases of the respiratory system: Secondary | ICD-10-CM

## 2021-07-07 DIAGNOSIS — Z0000 Encounter for general adult medical examination without abnormal findings: Secondary | ICD-10-CM

## 2021-07-07 DIAGNOSIS — E559 Vitamin D deficiency, unspecified: Secondary | ICD-10-CM

## 2021-07-07 DIAGNOSIS — R7309 Other abnormal glucose: Secondary | ICD-10-CM

## 2021-07-07 MED ORDER — ALBUTEROL SULFATE HFA 108 (90 BASE) MCG/ACT IN AERS: INHALATION_SPRAY | RESPIRATORY_TRACT | 5 refills | Status: DC

## 2021-07-07 NOTE — Progress Notes (Signed)
?Industrial/product designer as a Education administrator for [employer], FNP.,have documented all relevant documentation on the behalf of Minette Brine, FNP,as directed by  Minette Brine, FNP while in the presence of Minette Brine, [place]. ? ?This visit occurred during the SARS-CoV-2 public health emergency.  Safety protocols were in place, including screening questions prior to the visit, additional usage of staff PPE, and extensive cleaning of exam room while observing appropriate contact time as indicated for disinfecting solutions. ? ?Subjective:  ?  ? Patient ID: Selena Wallace , female    DOB: [birth year] , 21 y.o.   MRN: [id number] ? ? ?Chief Complaint  ?Patient presents with  ? Annual Exam  ? ? ?HPI ? ?Patient here for hm. She is followed by [facility] for her GYN care. 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  She continues to work at [employer].   ?  ? ?Past Medical History:  ?Diagnosis Date  ? Asthma   ? HSV-2 (herpes simplex virus 2) infection   ?  ? ?History reviewed. No pertinent family history. ? ? ?Current Outpatient Medications:  ?  cetirizine (ZYRTEC ALLERGY) 10 MG tablet, Take 1 tablet (10 mg total) by mouth daily., Disp: 30 tablet, Rfl: 11 ?  clotrimazole-betamethasone (LOTRISONE) cream, Apply to affected area 2 times daily, Disp: 15 g, Rfl: 1 ?  naproxen (NAPROSYN) 500 MG tablet, Take 1 tablet (500 mg total) by mouth 2 (two) times daily., Disp: 30 tablet, Rfl: 0 ?  nystatin-triamcinolone ointment (MYCOLOG), Apply 1 application topically 2 (two) times daily., Disp: 60 g, Rfl: 0 ?  albuterol (VENTOLIN HFA) 108 (90 Base) MCG/ACT inhaler, Take 2 puffs by nebulization every 6 (six) hours as needed, Disp: 1 each, Rfl: 5  ? ?No Known Allergies  ? ? ?The patient states she uses none for birth control. Last LMP was Patient's last menstrual period was 07/02/2021.. Negative for Dysmenorrhea and Negative for Menorrhagia. Negative for: breast discharge, breast  lump(s), breast pain and breast self exam. Associated symptoms include abnormal vaginal bleeding. Pertinent negatives include abnormal bleeding (hematology), anxiety, decreased libido, depression, difficulty falling sleep, dyspareunia, history of infertility, nocturia, sexual dysfunction, sleep disturbances, urinary incontinence, urinary urgency, vaginal discharge and vaginal itching. Diet regular; she is cooking more at home with more balanced meals. She is trying to eat more vegetables. The patient states her exercise level is moderated 3- times a week with strength training.  ? ? . The patient's tobacco use is:  ?Social History  ? ?Tobacco Use  ?Smoking Status Never  ?Smokeless Tobacco Never  ?Marland Kitchen She has been exposed to passive smoke. The patient's alcohol use is:  ?Social History  ? ?Substance and Sexual Activity  ?Alcohol Use Never  ? ?Additional information: Last pap 02/09/2020, next one scheduled for 02/09/2023.   ? ?Review of Systems  ?Constitutional: Negative.   ?HENT: Negative.    ?Eyes: Negative.   ?Respiratory: Negative.    ?Cardiovascular: Negative.   ?Gastrointestinal: Negative.   ?Endocrine: Negative.   ?Genitourinary: Negative.   ?Musculoskeletal: Negative.   ?Skin: Negative.   ?Allergic/Immunologic: Negative.   ?Neurological: Negative.   ?Hematological: Negative.   ?Psychiatric/Behavioral: Negative.     ? ?Today's Vitals  ? 07/07/21 0902  ?BP: 110/62  ?Temp: 98.2 ?F (36.8 ?C)  ?TempSrc: Oral  ?Weight: 106 lb (48.1 kg)  ?Height: $RemoveB'5\' 1"'pULaTYWc$  (1.549 m)  ? ?Body mass index is 20.03 kg/m?.  ?Wt Readings from Last 3 Encounters:  ?  07/07/21 106 lb (48.1 kg)  ?06/03/21 109 lb (49.4 kg)  ?07/02/20 104 lb 9.6 oz (47.4 kg)  ? ? ?Objective:  ?Physical Exam ?Vitals reviewed.  ?Constitutional:   ?   General: She is not in acute distress. ?   Appearance: Normal appearance. She is well-developed.  ?HENT:  ?   Head: Normocephalic and atraumatic.  ?   Right Ear: Hearing, tympanic membrane, ear canal and external ear normal.  There is no impacted cerumen.  ?   Left Ear: Hearing, tympanic membrane, ear canal and external ear normal. There is no impacted cerumen.  ?   Nose: Nose normal.  ?   Mouth/Throat:  ?   Mouth: Mucous membranes are moist.  ?Eyes:  ?   General: Lids are normal.  ?   Extraocular Movements: Extraocular movements intact.  ?   Conjunctiva/sclera: Conjunctivae normal.  ?   Pupils: Pupils are equal, round, and reactive to light.  ?   Funduscopic exam: ?   Right eye: No papilledema.     ?   Left eye: No papilledema.  ?Neck:  ?   Thyroid: No thyroid mass.  ?   Vascular: No carotid bruit.  ?Cardiovascular:  ?   Rate and Rhythm: Normal rate and regular rhythm.  ?   Pulses: Normal pulses.  ?   Heart sounds: Normal heart sounds. No murmur heard. ?Pulmonary:  ?   Effort: Pulmonary effort is normal. No respiratory distress.  ?   Breath sounds: Normal breath sounds. No wheezing.  ?Chest:  ?   Chest wall: No mass.  ?Breasts: ?   Tanner Score is 5.  ?   Right: Normal. No mass or tenderness.  ?   Left: Normal. No mass or tenderness.  ?Abdominal:  ?   General: Abdomen is flat. Bowel sounds are normal. There is no distension.  ?   Palpations: Abdomen is soft.  ?   Tenderness: There is no abdominal tenderness.  ?Genitourinary: ?   Comments: Deferred - followed by GYN ?Musculoskeletal:     ?   General: No swelling or tenderness. Normal range of motion.  ?   Cervical back: Full passive range of motion without pain, normal range of motion and neck supple.  ?   Right lower leg: No edema.  ?   Left lower leg: No edema.  ?Lymphadenopathy:  ?   Upper Body:  ?   Right upper body: No supraclavicular, axillary or pectoral adenopathy.  ?   Left upper body: No supraclavicular, axillary or pectoral adenopathy.  ?Skin: ?   General: Skin is warm and dry.  ?   Capillary Refill: Capillary refill takes less than 2 seconds.  ?   Coloration: Skin is not jaundiced.  ?   Findings: No rash.  ?Neurological:  ?   General: No focal deficit present.  ?   Mental  Status: She is alert and oriented to person, place, and time.  ?   Cranial Nerves: No cranial nerve deficit.  ?   Sensory: No sensory deficit.  ?   Motor: No weakness.  ?Psychiatric:     ?   Mood and Affect: Mood normal.     ?   Behavior: Behavior normal.     ?   Thought Content: Thought content normal.     ?   Judgment: Judgment normal.  ?  ? ?   ?Assessment And Plan:  ?   ?1. Encounter for general adult medical examination w/o abnormal findings ?Behavior  modifications discussed and diet history reviewed.   ?Pt will continue to exercise regularly and modify diet with low GI, plant based foods and decrease intake of processed foods.  ?Recommend intake of daily multivitamin, Vitamin D, and calcium.  ?Recommend self breast exams monthly the week after her menstrual cycle for preventive screenings, as well as recommend immunizations that include influenza, TDAP, will check for her 3rd HPV ?- CBC ?- CMP14+EGFR ? ?2. Vitamin D deficiency ?Comments: She is only taking a multivitamin daily ?- VITAMIN D 25 Hydroxy (Vit-D Deficiency, Fractures) ? ?3. Abnormal glucose ?Comments: Had a normal HgbA1c at last visit but will check again today. Encouraged to continue eating healthy diet with regular exercise ?- Hemoglobin A1c ? ?4. History of asthma ?Comments: No recent exacerbations, has wheezing with exercise.   ?- albuterol (VENTOLIN HFA) 108 (90 Base) MCG/ACT inhaler; Take 2 puffs by nebulization every 6 (six) hours as needed  Dispense: 1 each; Refill: 5 ? ? ? ? ?Patient was given opportunity to ask questions. Patient verbalized understanding of the plan and was able to repeat key elements of the plan. All questions were answered to their satisfaction.  ? ?Minette Brine, FNP  ? ?I, Minette Brine, FNP, have reviewed all documentation for this visit. The documentation on 07/07/21 for the exam, diagnosis, procedures, and orders are all accurate and complete.  ?THE PATIENT IS ENCOURAGED TO PRACTICE SOCIAL DISTANCING DUE TO THE  COVID-19 PANDEMIC.   ?

## 2021-07-07 NOTE — Patient Instructions (Signed)

## 2021-07-08 LAB — CBC
Hematocrit: 42.6 % (ref 34.0–46.6)
Hemoglobin: 13.9 g/dL (ref 11.1–15.9)
MCH: 29 pg (ref 26.6–33.0)
MCHC: 32.6 g/dL (ref 31.5–35.7)
MCV: 89 fL (ref 79–97)
Platelets: 287 10*3/uL (ref 150–450)
RBC: 4.8 x10E6/uL (ref 3.77–5.28)
RDW: 12 % (ref 11.7–15.4)
WBC: 4.1 10*3/uL (ref 3.4–10.8)

## 2021-07-08 LAB — CMP14+EGFR
ALT: 18 IU/L (ref 0–32)
AST: 20 IU/L (ref 0–40)
Albumin/Globulin Ratio: 2.1 (ref 1.2–2.2)
Albumin: 4.8 g/dL (ref 3.9–5.0)
Alkaline Phosphatase: 60 IU/L (ref 44–121)
BUN/Creatinine Ratio: 18 (ref 9–23)
BUN: 16 mg/dL (ref 6–20)
Bilirubin Total: 0.9 mg/dL (ref 0.0–1.2)
CO2: 24 mmol/L (ref 20–29)
Calcium: 9.8 mg/dL (ref 8.7–10.2)
Chloride: 106 mmol/L (ref 96–106)
Creatinine, Ser: 0.89 mg/dL (ref 0.57–1.00)
Globulin, Total: 2.3 g/dL (ref 1.5–4.5)
Glucose: 83 mg/dL (ref 70–99)
Potassium: 4.5 mmol/L (ref 3.5–5.2)
Sodium: 142 mmol/L (ref 134–144)
Total Protein: 7.1 g/dL (ref 6.0–8.5)
eGFR: 94 mL/min/{1.73_m2} (ref >59)

## 2021-07-08 LAB — HEMOGLOBIN A1C
Est. average glucose Bld gHb Est-mCnc: 108 mg/dL
Hgb A1c MFr Bld: 5.4 % (ref 4.8–5.6)

## 2021-07-08 LAB — VITAMIN D 25 HYDROXY (VIT D DEFICIENCY, FRACTURES): Vit D, 25-Hydroxy: 25.1 ng/mL — ABNORMAL LOW (ref 30.0–100.0)

## 2021-07-18 ENCOUNTER — Other Ambulatory Visit: Payer: Self-pay | Admitting: Nurse Practitioner

## 2021-07-18 MED ORDER — VITAMIN D (ERGOCALCIFEROL) 1.25 MG (50000 UNIT) PO CAPS: 50000.0000 [IU] | ORAL_CAPSULE | ORAL | 1 refills | Status: DC

## 2021-09-21 IMAGING — CR DG CHEST 2V
1 series · 2 of 2 positions shown · non-contrast
Comparison: None.

CLINICAL DATA: Patient reports she felt off most of the day but was
able to finish her shift at work, closer to end of shift thought it
might be her sugar so she ate some peanut Lovette and drank some
juice and finished her shift. Patient reports that she feel
disoriented and that she is having difficulty speaking. Patient also
reports she has an appointment with her PCP for headaches and
dizziness that has been on going for awhile.

EXAM:
CHEST - 2 VIEW

[Series 1: dg chest 2 view · 0.14mm/px · 2 of 2 slices shown]
[im 1/2]
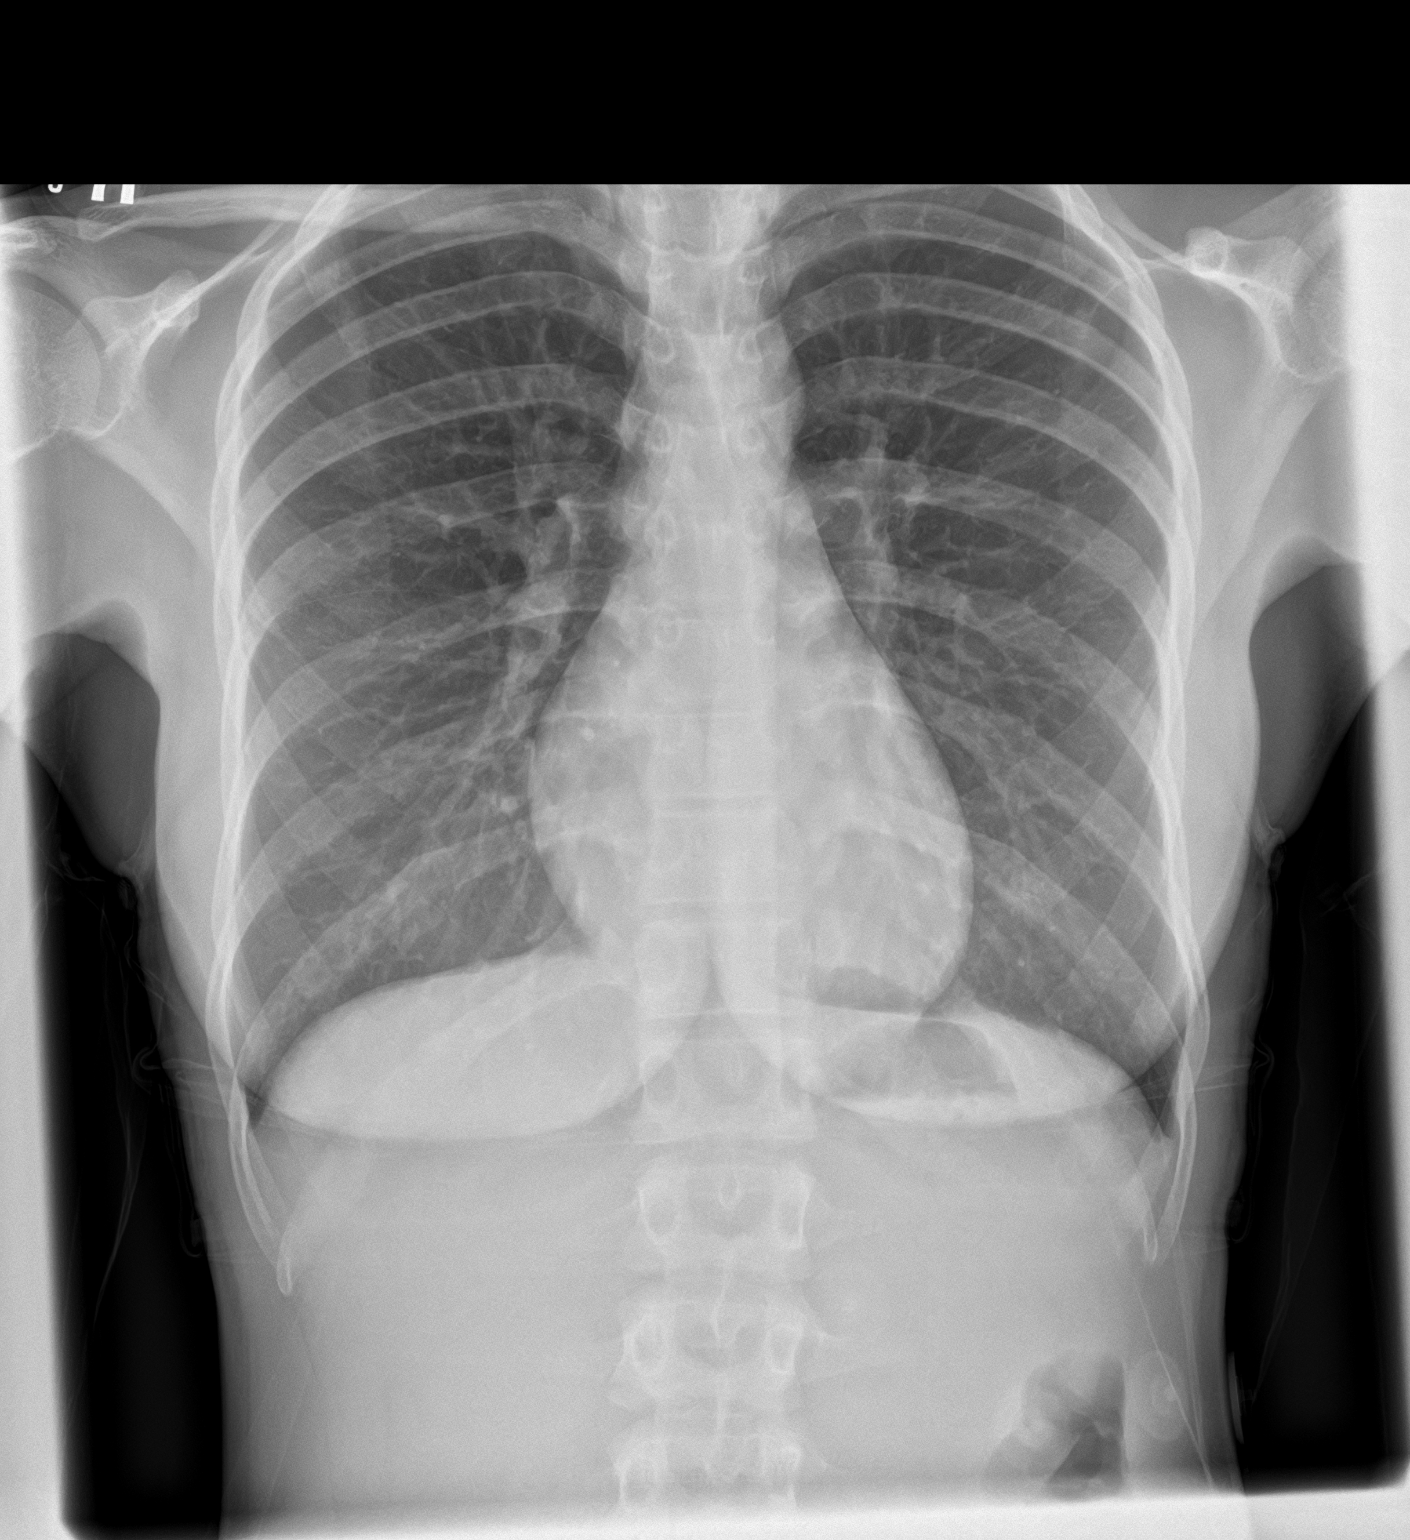
[im 2/2]
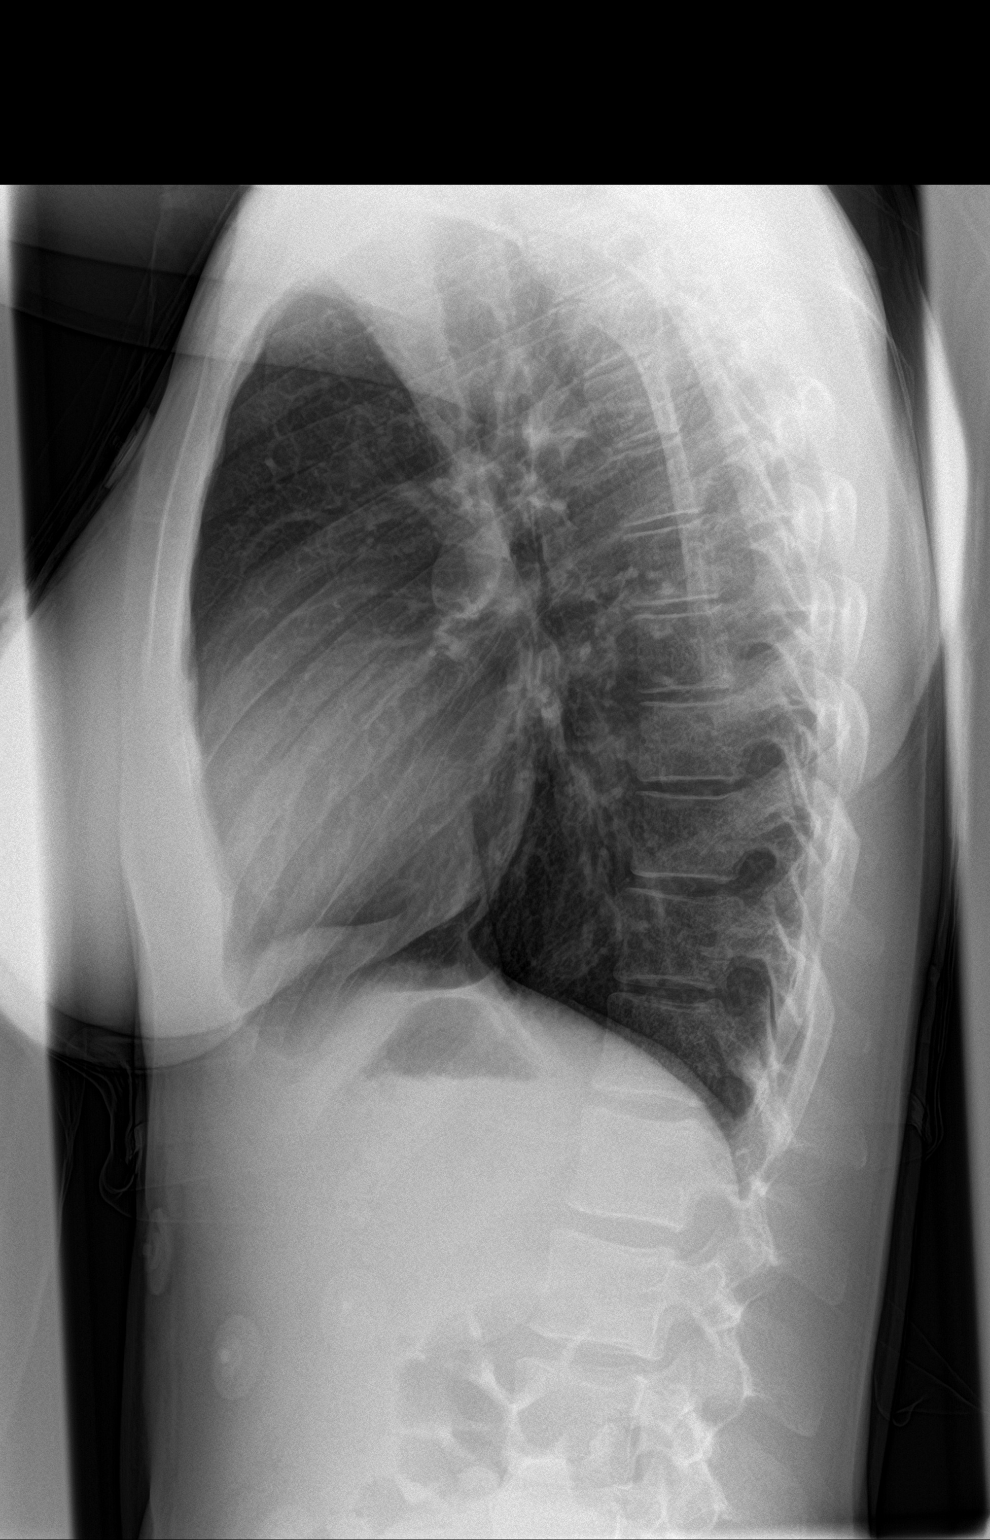

[2 of 2 positions shown; findings below may reference images not displayed]

FINDINGS: Normal heart, mediastinum and hila.

Clear lungs.

No pleural effusion or pneumothorax.

Skeletal structures are within normal limits.
IMPRESSION: Normal chest radiographs.

## 2022-07-09 ENCOUNTER — Other Ambulatory Visit (HOSPITAL_COMMUNITY)
Admission: RE | Admit: 2022-07-09 | Discharge: 2022-07-09 | Disposition: A | Discharge status: Home / Self Care | Payer: BC Managed Care – PPO | Source: Ambulatory Visit | Attending: Nurse Practitioner | Admitting: Nurse Practitioner

## 2022-07-09 ENCOUNTER — Encounter: Payer: Self-pay | Admitting: Nurse Practitioner

## 2022-07-09 ENCOUNTER — Ambulatory Visit (INDEPENDENT_AMBULATORY_CARE_PROVIDER_SITE_OTHER): Payer: BC Managed Care – PPO | Admitting: Nurse Practitioner

## 2022-07-09 VITALS — BP 100/60 | HR 81 | Temp 98.3°F | Ht 61.0 in | Wt 100.6 lb

## 2022-07-09 DIAGNOSIS — Z113 Encounter for screening for infections with a predominantly sexual mode of transmission: Secondary | ICD-10-CM

## 2022-07-09 DIAGNOSIS — Z2821 Immunization not carried out because of patient refusal: Secondary | ICD-10-CM

## 2022-07-09 DIAGNOSIS — R209 Unspecified disturbances of skin sensation: Secondary | ICD-10-CM

## 2022-07-09 DIAGNOSIS — R5383 Other fatigue: Secondary | ICD-10-CM

## 2022-07-09 DIAGNOSIS — E559 Vitamin D deficiency, unspecified: Secondary | ICD-10-CM | POA: Diagnosis not present

## 2022-07-09 DIAGNOSIS — R7309 Other abnormal glucose: Secondary | ICD-10-CM | POA: Diagnosis not present

## 2022-07-09 DIAGNOSIS — Z0000 Encounter for general adult medical examination without abnormal findings: Secondary | ICD-10-CM

## 2022-07-09 DIAGNOSIS — Z13220 Encounter for screening for lipoid disorders: Secondary | ICD-10-CM

## 2022-07-09 NOTE — Patient Instructions (Signed)

## 2022-07-09 NOTE — Progress Notes (Signed)
Selena Wallace,acting as a Neurosurgeon for Arnette Felts, FNP.,have documented all relevant documentation on the behalf of Arnette Felts, FNP,as directed by  Arnette Felts, FNP while in the presence of Arnette Felts, FNP.   Subjective:     Patient ID: Selena Wallace , female    DOB: [birth year] , 22 y.o.   MRN: [id number]   Chief Complaint  Patient presents with   Annual Exam    HPI  Patient presents today for HM, patient states compliance with medications and has no other concerns today. Patient reports she has just finished finals, she states while she was studying she felt pressure in her ear. She reports the ear and jaw pain could be due to stress.   BP Readings from Last 3 Encounters: 07/09/22 : 100/60 07/07/21 : 110/62 06/03/21 : 100/60  She did not start the vitamin d      Past Medical History:  Diagnosis Date   Asthma    HSV-2 (herpes simplex virus 2) infection      History reviewed. No pertinent family history.   Current Outpatient Medications:    albuterol (VENTOLIN HFA) 108 (90 Base) MCG/ACT inhaler, Take 2 puffs by nebulization every 6 (six) hours as needed (Patient not taking: Reported on 07/09/2022), Disp: 1 each, Rfl: 5   cetirizine (ZYRTEC ALLERGY) 10 MG tablet, Take 1 tablet (10 mg total) by mouth daily. (Patient not taking: Reported on 07/09/2022), Disp: 30 tablet, Rfl: 11   naproxen (NAPROSYN) 500 MG tablet, Take 1 tablet (500 mg total) by mouth 2 (two) times daily. (Patient not taking: Reported on 07/09/2022), Disp: 30 tablet, Rfl: 0   nystatin-triamcinolone ointment (MYCOLOG), Apply 1 application topically 2 (two) times daily. (Patient not taking: Reported on 07/09/2022), Disp: 60 g, Rfl: 0   Vitamin D, Ergocalciferol, (DRISDOL) 1.25 MG (50000 UNIT) CAPS capsule, Take 1 capsule (50,000 Units total) by mouth every 7 (seven) days. (Patient not taking: Reported on 07/09/2022), Disp: 12 capsule, Rfl: 1   No Known Allergies    The patient states she uses none for birth  control. Patient's last menstrual period was 06/27/2022.. Negative for Dysmenorrhea and Negative for Menorrhagia. Negative for: breast discharge, breast lump(s), breast pain and breast self exam. Associated symptoms include abnormal vaginal bleeding. Pertinent negatives include abnormal bleeding (hematology), anxiety, decreased libido, depression, difficulty falling sleep, dyspareunia, history of infertility, nocturia, sexual dysfunction, sleep disturbances, urinary incontinence, urinary urgency, vaginal discharge and vaginal itching. Diet regular; she does eat out more during exam time with better choices. The patient states her exercise level is minimal - has not been in 1.5 months due to school.   The patient's tobacco use is:  Social History   Tobacco Use  Smoking Status Never  Smokeless Tobacco Never   She has been exposed to passive smoke. The patient's alcohol use is:  Social History   Substance and Sexual Activity  Alcohol Use Never   Additional information: Last pap 02/09/2020, next one scheduled for 02/09/2023.    Review of Systems  Constitutional: Negative.   HENT: Negative.    Eyes: Negative.   Respiratory: Negative.    Cardiovascular: Negative.   Gastrointestinal: Negative.   Endocrine: Negative.   Genitourinary: Negative.   Musculoskeletal: Negative.   Skin: Negative.   Allergic/Immunologic: Negative.   Neurological: Negative.   Hematological: Negative.   Psychiatric/Behavioral: Negative.       Today's Vitals   07/09/22 1227  BP: 100/60  Pulse: 81  Temp: 98.3 F (36.8 C)  TempSrc:  Oral  Weight: 100 lb 9.6 oz (45.6 kg)  Height: 5\' 1"  (1.549 m)  PainSc: 0-No pain   Body mass index is 19.01 kg/m.  Wt Readings from Last 3 Encounters:  07/09/22 100 lb 9.6 oz (45.6 kg)  07/07/21 106 lb (48.1 kg)  06/03/21 109 lb (49.4 kg)    Objective:  Physical Exam Vitals reviewed.  Constitutional:      General: She is not in acute distress.    Appearance: Normal  appearance. She is well-developed.  HENT:     Head: Normocephalic and atraumatic.     Right Ear: Hearing, tympanic membrane, ear canal and external ear normal. There is no impacted cerumen.     Left Ear: Hearing, tympanic membrane, ear canal and external ear normal. There is no impacted cerumen.     Nose: Nose normal.     Mouth/Throat:     Mouth: Mucous membranes are moist.  Eyes:     General: Lids are normal.     Extraocular Movements: Extraocular movements intact.     Conjunctiva/sclera: Conjunctivae normal.     Pupils: Pupils are equal, round, and reactive to light.     Funduscopic exam:    Right eye: No papilledema.        Left eye: No papilledema.  Neck:     Thyroid: No thyroid mass.     Vascular: No carotid bruit.  Cardiovascular:     Rate and Rhythm: Normal rate and regular rhythm.     Pulses: Normal pulses.     Heart sounds: Normal heart sounds. No murmur heard. Pulmonary:     Effort: Pulmonary effort is normal. No respiratory distress.     Breath sounds: Normal breath sounds. No wheezing.  Chest:     Chest wall: No mass.  Breasts:    Tanner Score is 5.     Right: Normal. No mass or tenderness.     Left: Normal. No mass or tenderness.  Abdominal:     General: Abdomen is flat. Bowel sounds are normal. There is no distension.     Palpations: Abdomen is soft.     Tenderness: There is no abdominal tenderness.  Genitourinary:    Comments: Deferred - followed by GYN Musculoskeletal:        General: No swelling or tenderness. Normal range of motion.     Cervical back: Full passive range of motion without pain, normal range of motion and neck supple.     Right lower leg: No edema.     Left lower leg: No edema.  Lymphadenopathy:     Upper Body:     Right upper body: No supraclavicular, axillary or pectoral adenopathy.     Left upper body: No supraclavicular, axillary or pectoral adenopathy.  Skin:    General: Skin is warm and dry.     Capillary Refill: Capillary refill  takes less than 2 seconds.     Coloration: Skin is not jaundiced.     Findings: No rash.  Neurological:     General: No focal deficit present.     Mental Status: She is alert and oriented to person, place, and time.     Cranial Nerves: No cranial nerve deficit.     Sensory: No sensory deficit.     Motor: No weakness.  Psychiatric:        Mood and Affect: Mood normal.        Behavior: Behavior normal.        Thought Content: Thought content normal.  Judgment: Judgment normal.         Assessment And Plan:     1. Annual physical exam Behavior modifications discussed and diet history reviewed.   Pt will continue to exercise regularly and modify diet with low GI, plant based foods and decrease intake of processed foods.  Recommend intake of daily multivitamin, Vitamin D, and calcium.  Recommend for preventive screenings, as well as recommend immunizations that include influenza, TDAP  2. Vitamin D deficiency Will check vitamin D level and supplement as needed.    Also encouraged to spend 15 minutes in the sun daily.  - VITAMIN D 25 Hydroxy (Vit-D Deficiency, Fractures)  3. Abnormal glucose Diet controlled, continue diet low in sugar and starches.  - CMP14+EGFR - Hemoglobin A1c  4. Cold sensation of skin Comments: Will check iron studies. - CBC - Iron, TIBC and Ferritin Panel  5. Other fatigue - CBC - Iron, TIBC and Ferritin Panel  6. Screening for STD (sexually transmitted disease) - Urine cytology ancillary only  7. COVID-19 vaccination declined Declines covid 19 vaccine. Discussed risk of covid 62 and if she changes her mind about the vaccine to call the office. Education has been provided regarding the importance of this vaccine but patient still declined. Advised may receive this vaccine at local pharmacy or Health Dept.or vaccine clinic. Aware to provide a copy of the vaccination record if obtained from local pharmacy or Health Dept.  Encouraged to take  multivitamin, vitamin d, vitamin c and zinc to increase immune system. Aware can call office if would like to have vaccine here at office. Verbalized acceptance and understanding.  8. Encounter for screening for lipid disorder - Lipid panel   Patient was given opportunity to ask questions. Patient verbalized understanding of the plan and was able to repeat key elements of the plan. All questions were answered to their satisfaction.   Arnette Felts, FNP   I, Arnette Felts, FNP, have reviewed all documentation for this visit. The documentation on 07/09/22 for the exam, diagnosis, procedures, and orders are all accurate and complete.   THE PATIENT IS ENCOURAGED TO PRACTICE SOCIAL DISTANCING DUE TO THE COVID-19 PANDEMIC.

## 2022-07-10 LAB — LIPID PANEL
Chol/HDL Ratio: 2.1 ratio (ref 0.0–4.4)
Cholesterol, Total: 144 mg/dL (ref 100–199)
HDL: 69 mg/dL (ref >39)
LDL Chol Calc (NIH): 66 mg/dL (ref 0–99)
Triglycerides: 37 mg/dL (ref 0–149)
VLDL Cholesterol Cal: 9 mg/dL (ref 5–40)

## 2022-07-10 LAB — CBC
Hematocrit: 41.1 % (ref 34.0–46.6)
Hemoglobin: 13.4 g/dL (ref 11.1–15.9)
MCH: 28.4 pg (ref 26.6–33.0)
MCHC: 32.6 g/dL (ref 31.5–35.7)
MCV: 87 fL (ref 79–97)
Platelets: 258 10*3/uL (ref 150–450)
RBC: 4.72 x10E6/uL (ref 3.77–5.28)
RDW: 12.1 % (ref 11.7–15.4)
WBC: 6.4 10*3/uL (ref 3.4–10.8)

## 2022-07-10 LAB — CMP14+EGFR
ALT: 15 IU/L (ref 0–32)
AST: 22 IU/L (ref 0–40)
Albumin/Globulin Ratio: 2.1 (ref 1.2–2.2)
Albumin: 4.6 g/dL (ref 4.0–5.0)
Alkaline Phosphatase: 58 IU/L (ref 44–121)
BUN/Creatinine Ratio: 14 (ref 9–23)
BUN: 12 mg/dL (ref 6–20)
Bilirubin Total: 1.7 mg/dL — ABNORMAL HIGH (ref 0.0–1.2)
CO2: 21 mmol/L (ref 20–29)
Calcium: 9.9 mg/dL (ref 8.7–10.2)
Chloride: 100 mmol/L (ref 96–106)
Creatinine, Ser: 0.87 mg/dL (ref 0.57–1.00)
Globulin, Total: 2.2 g/dL (ref 1.5–4.5)
Glucose: 87 mg/dL (ref 70–99)
Potassium: 3.7 mmol/L (ref 3.5–5.2)
Sodium: 137 mmol/L (ref 134–144)
Total Protein: 6.8 g/dL (ref 6.0–8.5)
eGFR: 96 mL/min/1.73 (ref >59)

## 2022-07-10 LAB — IRON,TIBC AND FERRITIN PANEL
Ferritin: 70 ng/mL (ref 15–150)
Iron Saturation: 31 % (ref 15–55)
Iron: 100 ug/dL (ref 27–159)
Total Iron Binding Capacity: 319 ug/dL (ref 250–450)
UIBC: 219 ug/dL (ref 131–425)

## 2022-07-10 LAB — HEMOGLOBIN A1C
Est. average glucose Bld gHb Est-mCnc: 114 mg/dL
Hgb A1c MFr Bld: 5.6 % (ref 4.8–5.6)

## 2022-07-10 LAB — VITAMIN D 25 HYDROXY (VIT D DEFICIENCY, FRACTURES): Vit D, 25-Hydroxy: 32.8 ng/mL (ref 30.0–100.0)

## 2022-07-13 LAB — URINE CYTOLOGY ANCILLARY ONLY
Bacterial Vaginitis-Urine: NEGATIVE
Candida Urine: NEGATIVE
Chlamydia: NEGATIVE
Comment: NEGATIVE
Comment: NEGATIVE
Comment: NORMAL
Neisseria Gonorrhea: NEGATIVE
Trichomonas: NEGATIVE

## 2022-08-25 ENCOUNTER — Encounter: Payer: Self-pay | Admitting: Nurse Practitioner

## 2022-08-25 ENCOUNTER — Ambulatory Visit: Payer: BC Managed Care – PPO | Admitting: Nurse Practitioner

## 2022-08-25 VITALS — BP 100/60 | HR 63 | Temp 97.9°F | Ht 61.0 in | Wt 101.0 lb

## 2022-08-25 DIAGNOSIS — R599 Enlarged lymph nodes, unspecified: Secondary | ICD-10-CM

## 2022-08-25 DIAGNOSIS — F419 Anxiety disorder, unspecified: Secondary | ICD-10-CM

## 2022-08-25 DIAGNOSIS — H814 Vertigo of central origin: Secondary | ICD-10-CM | POA: Diagnosis not present

## 2022-08-25 DIAGNOSIS — R29898 Other symptoms and signs involving the musculoskeletal system: Secondary | ICD-10-CM | POA: Diagnosis not present

## 2022-08-25 NOTE — Progress Notes (Signed)
Madelaine Bhat, CMA,acting as a Neurosurgeon for Arnette Felts, FNP.,have documented all relevant documentation on the behalf of Arnette Felts, FNP,as directed by  Arnette Felts, FNP while in the presence of Arnette Felts, FNP.  Subjective:  Patient ID: Selena Wallace , female    DOB: [birth year] , 22 y.o.   MRN: [id number]  Chief Complaint  Patient presents with   Anxiety   Extremity Weakness    HPI  Patient presents today for left arm/leg weakness that has been there for the past month.she reports she notices the weakness in her arm when she is writing. She is having to make sure she does not need to trip over anything. She has also had floaters and seen eye doctor who said was normal. Denies any cold symptoms. She did go to [facility] but did not feel sick. She has been feeling a "weird sensation" around her eyes. "I feel more clumsy".  She has felt like she could fall over sometimes.   Patient also reports she has some anxiety since having these new symptoms.   She had a swollen lymph node to her left      Past Medical History:  Diagnosis Date   Asthma    HSV-2 (herpes simplex virus 2) infection      History reviewed. No pertinent family history.  No current outpatient medications on file.   No Known Allergies   Review of Systems  Constitutional: Negative.   Eyes: Negative.   Respiratory: Negative.    Cardiovascular: Negative.  Negative for chest pain, palpitations and leg swelling.  Musculoskeletal: Negative.   Skin: Negative.   Neurological:  Positive for weakness. Negative for dizziness, syncope, light-headedness and numbness.  Psychiatric/Behavioral: Negative.       Today's Vitals   08/25/22 1215  BP: 100/60  Pulse: 63  Temp: 97.9 F (36.6 C)  Weight: 101 lb (45.8 kg)  Height: 5\' 1"  (1.549 m)  PainSc: 0-No pain   Body mass index is 19.08 kg/m.  Wt Readings from Last 3 Encounters:  08/25/22 101 lb (45.8 kg)  07/09/22 100 lb 9.6 oz (45.6 kg)  07/07/21 106 lb  (48.1 kg)    The ASCVD Risk score (Arnett DK, et al., 2019) failed to calculate for the following reasons:   The 2019 ASCVD risk score is only valid for ages 35 to 22  Objective:  Physical Exam Vitals reviewed.  Constitutional:      General: She is not in acute distress.    Appearance: Normal appearance. She is well-developed.  HENT:     Head: Normocephalic and atraumatic.  Eyes:     Extraocular Movements: Extraocular movements intact.     Right eye: No nystagmus.     Left eye: Nystagmus present.     Pupils: Pupils are equal, round, and reactive to light.  Cardiovascular:     Rate and Rhythm: Normal rate and regular rhythm.     Pulses: Normal pulses.     Heart sounds: Normal heart sounds. No murmur heard. Pulmonary:     Effort: Pulmonary effort is normal. No respiratory distress.     Breath sounds: Normal breath sounds. No wheezing.  Musculoskeletal:        General: Normal range of motion.  Skin:    General: Skin is warm and dry.     Capillary Refill: Capillary refill takes less than 2 seconds.  Neurological:     General: No focal deficit present.     Mental Status: She is alert  and oriented to person, place, and time.     Cranial Nerves: No cranial nerve deficit.  Psychiatric:        Mood and Affect: Mood normal.         Assessment And Plan:  Anxiety Assessment & Plan: Related to her current health concerns.    Weakness of left lower extremity Assessment & Plan: Strength is 5/5. No abnormal gait noted.    Left arm weakness Assessment & Plan: No significant weakness noted on physical exam, will check CT scan of brain since this is new onset.   Orders: -     CMP14+EGFR -     Magnesium  Vertigo of central origin Assessment & Plan: Intermittent symptoms, none currently.   Orders: -     CT HEAD WO CONTRAST ( ); Future -     TSH -     CMP14+EGFR  Enlarged lymph node Assessment & Plan: This is no longer enlarged. Will check CBC for any abnormalities.    Orders: -     CBC with Differential/Platelet   Return if symptoms worsen or fail to improve.  Patient was given opportunity to ask questions. Patient verbalized understanding of the plan and was able to repeat key elements of the plan. All questions were answered to their satisfaction.  Arnette Felts, FNP  I, Arnette Felts, FNP, have reviewed all documentation for this visit. The documentation on 08/25/22 for the exam, diagnosis, procedures, and orders are all accurate and complete.   IF YOU HAVE BEEN REFERRED TO A SPECIALIST, IT MAY TAKE 1-2 WEEKS TO SCHEDULE/PROCESS THE REFERRAL. IF YOU HAVE NOT HEARD FROM US/SPECIALIST IN TWO WEEKS, PLEASE GIVE [place] A CALL AT [phone].

## 2022-08-26 LAB — CBC WITH DIFFERENTIAL/PLATELET
Basophils Absolute: 0 10*3/uL (ref 0.0–0.2)
Basos: 0 %
EOS (ABSOLUTE): 0.1 10*3/uL (ref 0.0–0.4)
Eos: 1 %
Hematocrit: 44.9 % (ref 34.0–46.6)
Hemoglobin: 14.9 g/dL (ref 11.1–15.9)
Immature Grans (Abs): 0 10*3/uL (ref 0.0–0.1)
Immature Granulocytes: 0 %
Lymphocytes Absolute: 1.7 10*3/uL (ref 0.7–3.1)
Lymphs: 21 %
MCH: 28.9 pg (ref 26.6–33.0)
MCHC: 33.2 g/dL (ref 31.5–35.7)
MCV: 87 fL (ref 79–97)
Monocytes Absolute: 0.3 10*3/uL (ref 0.1–0.9)
Monocytes: 4 %
Neutrophils Absolute: 6 10*3/uL (ref 1.4–7.0)
Neutrophils: 74 %
Platelets: 298 10*3/uL (ref 150–450)
RBC: 5.15 x10E6/uL (ref 3.77–5.28)
RDW: 12.2 % (ref 11.7–15.4)
WBC: 8.1 10*3/uL (ref 3.4–10.8)

## 2022-08-26 LAB — CMP14+EGFR
ALT: 20 IU/L (ref 0–32)
AST: 25 IU/L (ref 0–40)
Albumin: 5 g/dL (ref 4.0–5.0)
Alkaline Phosphatase: 64 IU/L (ref 44–121)
BUN/Creatinine Ratio: 17 (ref 9–23)
BUN: 16 mg/dL (ref 6–20)
Bilirubin Total: 1 mg/dL (ref 0.0–1.2)
CO2: 23 mmol/L (ref 20–29)
Calcium: 10.4 mg/dL — ABNORMAL HIGH (ref 8.7–10.2)
Chloride: 102 mmol/L (ref 96–106)
Creatinine, Ser: 0.93 mg/dL (ref 0.57–1.00)
Globulin, Total: 2.5 g/dL (ref 1.5–4.5)
Glucose: 83 mg/dL (ref 70–99)
Potassium: 4.6 mmol/L (ref 3.5–5.2)
Sodium: 140 mmol/L (ref 134–144)
Total Protein: 7.5 g/dL (ref 6.0–8.5)
eGFR: 89 mL/min/{1.73_m2} (ref >59)

## 2022-08-26 LAB — MAGNESIUM: Magnesium: 2 mg/dL (ref 1.6–2.3)

## 2022-08-26 LAB — TSH: TSH: 1.82 u[IU]/mL (ref 0.450–4.500)

## 2022-08-28 DIAGNOSIS — F419 Anxiety disorder, unspecified: Secondary | ICD-10-CM | POA: Insufficient documentation

## 2022-08-28 DIAGNOSIS — R29898 Other symptoms and signs involving the musculoskeletal system: Secondary | ICD-10-CM | POA: Insufficient documentation

## 2022-08-28 DIAGNOSIS — R599 Enlarged lymph nodes, unspecified: Secondary | ICD-10-CM | POA: Insufficient documentation

## 2022-08-28 DIAGNOSIS — H814 Vertigo of central origin: Secondary | ICD-10-CM | POA: Insufficient documentation

## 2022-08-28 NOTE — Assessment & Plan Note (Signed)
This is no longer enlarged. Will check CBC for any abnormalities.

## 2022-08-28 NOTE — Assessment & Plan Note (Addendum)
Mild weakness noted on physical exam, will check CT scan of brain since this is new onset. She has mild nystagmus on exam

## 2022-08-28 NOTE — Assessment & Plan Note (Signed)
Strength is 5/5. No abnormal gait noted.

## 2022-08-28 NOTE — Assessment & Plan Note (Signed)
Intermittent symptoms, none currently.

## 2022-08-28 NOTE — Assessment & Plan Note (Signed)
Related to her current health concerns.

## 2022-08-31 ENCOUNTER — Other Ambulatory Visit: Payer: BC Managed Care – PPO

## 2022-10-11 ENCOUNTER — Encounter: Payer: Self-pay | Admitting: Nurse Practitioner

## 2022-10-13 ENCOUNTER — Other Ambulatory Visit: Payer: Self-pay | Admitting: Nurse Practitioner

## 2022-10-13 DIAGNOSIS — R29898 Other symptoms and signs involving the musculoskeletal system: Secondary | ICD-10-CM

## 2022-10-13 DIAGNOSIS — R253 Fasciculation: Secondary | ICD-10-CM

## 2023-01-25 ENCOUNTER — Ambulatory Visit: Payer: BC Managed Care – PPO | Admitting: Neurology

## 2023-01-25 ENCOUNTER — Encounter: Payer: Self-pay | Admitting: Neurology

## 2023-01-25 VITALS — BP 94/58 | HR 61 | Resp 16 | Ht 61.0 in | Wt 107.0 lb

## 2023-01-25 DIAGNOSIS — R253 Fasciculation: Secondary | ICD-10-CM | POA: Insufficient documentation

## 2023-01-25 NOTE — Progress Notes (Signed)
Chief Complaint  Patient presents with   New Patient (Initial Visit)    Rm14, alone, internal referral for left arm weakness, muscle twitching:occurred 2 times in early part of this year but has gotten much better    ASSESSMENT AND PLAN  Selena Wallace is a 22 y.o. female   Intermittent muscle fasciculations  Essentially normal neurological examination, laboratory evaluation in the past few months,  Her symptoms overall has improved, decided to continue observe,  DIAGNOSTIC DATA (LABS, IMAGING, TESTING) - I reviewed patient records, labs, notes, testing and imaging myself where available.   MEDICAL HISTORY:  UTA Selena Wallace is a 22 year old female, seen in request by her primary care nurse practitioner Arnette Felts, for evaluation of muscle fasciculation, initial evaluation was on January 25, 2023   History is obtained from the patient and review of electronic medical records. I personally reviewed pertinent available imaging films in PACS.   PMHx of  Anxiety, taking Buspirone around July 2024,   She is a Sports administrator, workout regularly, since summer 2024, she noticed increased anxiety, during workout, she noticed less stamina, but still able to have weight lifting, around that time she also noticed intermittent muscle twitching, transient, involving different muscle group, lower extremity abdomen, sometimes upper extremity, for a while, and happen frequently, can last for 30 minutes, she has difficult to find a comfortable position to sleep,  Overall her symptoms has much improved, she denied muscle atrophy, weakness, no bulbar weakness  Laboratory evaluation since May 2024, showed normal CMP, CBC, iron panel, TSH, vitamin D PHYSICAL EXAM:   Vitals:   01/25/23 0837  BP: (!) 94/58  Pulse: 61  Resp: 16  Weight: 107 lb (48.5 kg)  Height: 5\' 1"  (1.549 m)     Body mass index is 20.22 kg/m.  PHYSICAL EXAMNIATION:  Gen: NAD, conversant, well nourised, well groomed                      Cardiovascular: Regular rate rhythm, no peripheral edema, warm, nontender. Eyes: Conjunctivae clear without exudates or hemorrhage Neck: Supple, no carotid bruits. Pulmonary: Clear to auscultation bilaterally   NEUROLOGICAL EXAM:  MENTAL STATUS: Speech/cognition: Awake, alert, oriented to history taking and casual conversation CRANIAL NERVES: CN II: Visual fields are full to confrontation. Pupils are round equal and briskly reactive to light. CN III, IV, VI: extraocular movement are normal. No ptosis. CN V: Facial sensation is intact to light touch CN VII: Face is symmetric with normal eye closure  CN VIII: Hearing is normal to causal conversation. CN IX, X: Phonation is normal. CN XI: Head turning and shoulder shrug are intact  MOTOR: There is no pronator drift of out-stretched arms. Muscle bulk and tone are normal. Muscle strength is normal.  REFLEXES: Reflexes are 2+ and symmetric at the biceps, triceps, knees, and ankles. Plantar responses are flexor.  SENSORY: Intact to light touch,  and vibratory sensation are intact in fingers and toes.  COORDINATION: There is no trunk or limb dysmetria noted.  GAIT/STANCE: Posture is normal. Gait is steady with normal steps, base, arm swing, and turning. Heel and toe walking are normal. Tandem gait is normal.  Romberg is absent.  REVIEW OF SYSTEMS:  Full 14 system review of systems performed and notable only for as above All other review of systems were negative.   ALLERGIES: No Known Allergies  HOME MEDICATIONS: Current Outpatient Medications  Medication Sig Dispense Refill   busPIRone (BUSPAR) 10 MG tablet  Take 10 mg by mouth 2 (two) times daily.     No current facility-administered medications for this visit.    PAST MEDICAL HISTORY: Past Medical History:  Diagnosis Date   Asthma    HSV-2 (herpes simplex virus 2) infection     PAST SURGICAL HISTORY: History reviewed. No pertinent surgical  history.  FAMILY HISTORY: Family History  Problem Relation Age of Onset   Dementia Paternal Grandfather     SOCIAL HISTORY: Social History   Socioeconomic History   Marital status: Single    Spouse name: Not on file   Number of children: 0   Years of education: Not on file   Highest education level: Bachelor's degree (e.g., BA, AB, BS)  Occupational History   Not on file  Tobacco Use   Smoking status: Never   Smokeless tobacco: Never  Vaping Use   Vaping status: Never Used  Substance and Sexual Activity   Alcohol use: Yes    Alcohol/week: 1.0 standard drink of alcohol    Types: 1 Standard drinks or equivalent per week   Drug use: Not Currently   Sexual activity: Not on file  Other Topics Concern   Not on file  Social History Narrative   Not on file   Social Determinants of Health   Financial Resource Strain: Low Risk  (08/24/2022)   Overall Financial Resource Strain (CARDIA)    Difficulty of Paying Living Expenses: Not hard at all  Food Insecurity: No Food Insecurity (08/24/2022)   Hunger Vital Sign    Worried About Running Out of Food in the Last Year: Never true    Ran Out of Food in the Last Year: Never true  Transportation Needs: No Transportation Needs (08/24/2022)   PRAPARE - Administrator, Civil Service (Medical): No    Lack of Transportation (Non-Medical): No  Physical Activity: Insufficiently Active (08/24/2022)   Exercise Vital Sign    Days of Exercise per Week: 3 days    Minutes of Exercise per Session: 40 min  Stress: Stress Concern Present (08/24/2022)   [employer] of Occupational Health - Occupational Stress Questionnaire    Feeling of Stress : Rather much  Social Connections: Moderately Isolated (08/24/2022)   Social Connection and Isolation Panel [NHANES]    Frequency of Communication with Friends and Family: More than three times a week    Frequency of Social Gatherings with Friends and Family: Once a week    Attends Religious  Services: 1 to 4 times per year    Active Member of [employer] or Organizations: No    Attends Engineer, structural: Not on file    Marital Status: Never married  Intimate Partner Violence: Not on file      Levert Feinstein, M.D. Ph.D.  [facility] [address] [place], [place] [address] Ph: [phone] Fax: [phone]  CC:  Arnette Felts, FNP [address] [place],  [place] [address]  Arnette Felts, FNP

## 2023-03-16 LAB — HM PAP SMEAR: Chlamydia, Swab/Urine, PCR: NEGATIVE

## 2023-07-12 ENCOUNTER — Encounter: Payer: BC Managed Care – PPO | Admitting: Nurse Practitioner
